# Patient Record
Sex: Male | Born: 1955 | Race: Black or African American | Hispanic: No | Marital: Single | Smoking: Current every day smoker
Health system: Southern US, Community
[De-identification: ages and names within clinical notes are randomized; demographics above are authoritative.]

## PROBLEM LIST (undated history)

## (undated) DIAGNOSIS — M1990 Unspecified osteoarthritis, unspecified site: Secondary | ICD-10-CM

## (undated) DIAGNOSIS — I639 Cerebral infarction, unspecified: Secondary | ICD-10-CM

## (undated) DIAGNOSIS — G8929 Other chronic pain: Secondary | ICD-10-CM

## (undated) DIAGNOSIS — I10 Essential (primary) hypertension: Secondary | ICD-10-CM

## (undated) DIAGNOSIS — R413 Other amnesia: Secondary | ICD-10-CM

## (undated) HISTORY — DX: Other chronic pain: G89.29

## (undated) HISTORY — DX: Essential (primary) hypertension: I10

## (undated) HISTORY — DX: Unspecified osteoarthritis, unspecified site: M19.90

## (undated) HISTORY — DX: Cerebral infarction, unspecified: I63.9

---
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
## 2017-09-02 ENCOUNTER — Other Ambulatory Visit: Payer: Self-pay

## 2017-09-02 ENCOUNTER — Encounter (HOSPITAL_COMMUNITY): Payer: Self-pay | Admitting: Emergency Medicine

## 2017-09-02 ENCOUNTER — Emergency Department (HOSPITAL_COMMUNITY): Payer: Medicaid Other

## 2017-09-02 ENCOUNTER — Inpatient Hospital Stay (HOSPITAL_COMMUNITY)
Admission: EM | Admit: 2017-09-02 | Discharge: 2017-09-05 | DRG: 065 | Disposition: A | Discharge status: Rehabilitation Facility | Payer: Medicaid Other | Attending: Neurology | Admitting: Neurology

## 2017-09-02 DIAGNOSIS — I129 Hypertensive chronic kidney disease with stage 1 through stage 4 chronic kidney disease, or unspecified chronic kidney disease: Secondary | ICD-10-CM | POA: Diagnosis present

## 2017-09-02 DIAGNOSIS — I610 Nontraumatic intracerebral hemorrhage in hemisphere, subcortical: Secondary | ICD-10-CM

## 2017-09-02 DIAGNOSIS — R4182 Altered mental status, unspecified: Secondary | ICD-10-CM | POA: Diagnosis present

## 2017-09-02 DIAGNOSIS — F17200 Nicotine dependence, unspecified, uncomplicated: Secondary | ICD-10-CM | POA: Diagnosis not present

## 2017-09-02 DIAGNOSIS — Z8249 Family history of ischemic heart disease and other diseases of the circulatory system: Secondary | ICD-10-CM | POA: Diagnosis not present

## 2017-09-02 DIAGNOSIS — R269 Unspecified abnormalities of gait and mobility: Secondary | ICD-10-CM | POA: Diagnosis not present

## 2017-09-02 DIAGNOSIS — I10 Essential (primary) hypertension: Secondary | ICD-10-CM | POA: Diagnosis not present

## 2017-09-02 DIAGNOSIS — R4701 Aphasia: Secondary | ICD-10-CM | POA: Diagnosis present

## 2017-09-02 DIAGNOSIS — R001 Bradycardia, unspecified: Secondary | ICD-10-CM | POA: Diagnosis present

## 2017-09-02 DIAGNOSIS — G8194 Hemiplegia, unspecified affecting left nondominant side: Secondary | ICD-10-CM | POA: Diagnosis present

## 2017-09-02 DIAGNOSIS — I615 Nontraumatic intracerebral hemorrhage, intraventricular: Secondary | ICD-10-CM | POA: Diagnosis present

## 2017-09-02 DIAGNOSIS — N182 Chronic kidney disease, stage 2 (mild): Secondary | ICD-10-CM | POA: Diagnosis present

## 2017-09-02 DIAGNOSIS — F17210 Nicotine dependence, cigarettes, uncomplicated: Secondary | ICD-10-CM | POA: Diagnosis present

## 2017-09-02 DIAGNOSIS — D37030 Neoplasm of uncertain behavior of the parotid salivary glands: Secondary | ICD-10-CM | POA: Diagnosis present

## 2017-09-02 DIAGNOSIS — R7989 Other specified abnormal findings of blood chemistry: Secondary | ICD-10-CM | POA: Diagnosis not present

## 2017-09-02 DIAGNOSIS — I161 Hypertensive emergency: Secondary | ICD-10-CM | POA: Diagnosis present

## 2017-09-02 DIAGNOSIS — R29703 NIHSS score 3: Secondary | ICD-10-CM | POA: Diagnosis present

## 2017-09-02 DIAGNOSIS — E785 Hyperlipidemia, unspecified: Secondary | ICD-10-CM | POA: Diagnosis present

## 2017-09-02 DIAGNOSIS — I619 Nontraumatic intracerebral hemorrhage, unspecified: Secondary | ICD-10-CM | POA: Diagnosis not present

## 2017-09-02 DIAGNOSIS — Z7982 Long term (current) use of aspirin: Secondary | ICD-10-CM

## 2017-09-02 DIAGNOSIS — I69398 Other sequelae of cerebral infarction: Secondary | ICD-10-CM | POA: Diagnosis not present

## 2017-09-02 DIAGNOSIS — N183 Chronic kidney disease, stage 3 (moderate): Secondary | ICD-10-CM | POA: Diagnosis not present

## 2017-09-02 DIAGNOSIS — I69319 Unspecified symptoms and signs involving cognitive functions following cerebral infarction: Secondary | ICD-10-CM | POA: Diagnosis not present

## 2017-09-02 DIAGNOSIS — R29810 Facial weakness: Secondary | ICD-10-CM | POA: Diagnosis present

## 2017-09-02 DIAGNOSIS — D649 Anemia, unspecified: Secondary | ICD-10-CM | POA: Diagnosis not present

## 2017-09-02 LAB — COMPREHENSIVE METABOLIC PANEL
ALBUMIN: 3.9 g/dL (ref 3.5–5.0)
ALT: 22 U/L (ref 0–44)
AST: 24 U/L (ref 15–41)
Alkaline Phosphatase: 104 U/L (ref 38–126)
Anion gap: 9 (ref 5–15)
BUN: 12 mg/dL (ref 8–23)
CHLORIDE: 106 mmol/L (ref 98–111)
CO2: 23 mmol/L (ref 22–32)
Calcium: 9.3 mg/dL (ref 8.9–10.3)
Creatinine, Ser: 1.5 mg/dL — ABNORMAL HIGH (ref 0.61–1.24)
GFR calc Af Amer: 56 mL/min — ABNORMAL LOW (ref >60)
GFR calc non Af Amer: 48 mL/min — ABNORMAL LOW (ref >60)
GLUCOSE: 100 mg/dL — AB (ref 70–99)
Potassium: 3.9 mmol/L (ref 3.5–5.1)
SODIUM: 138 mmol/L (ref 135–145)
Total Bilirubin: 0.5 mg/dL (ref 0.3–1.2)
Total Protein: 7.8 g/dL (ref 6.5–8.1)

## 2017-09-02 LAB — DIFFERENTIAL
Basophils Absolute: 0.1 10*3/uL (ref 0.0–0.1)
Basophils Relative: 1 %
EOS ABS: 0.1 10*3/uL (ref 0.0–0.7)
Eosinophils Relative: 1 %
Lymphocytes Relative: 26 %
Lymphs Abs: 1.7 10*3/uL (ref 0.7–4.0)
MONOS PCT: 7 %
Monocytes Absolute: 0.5 10*3/uL (ref 0.1–1.0)
NEUTROS PCT: 65 %
Neutro Abs: 4.2 10*3/uL (ref 1.7–7.7)

## 2017-09-02 LAB — I-STAT CHEM 8, ED
BUN: 16 mg/dL (ref 8–23)
CALCIUM ION: 1.17 mmol/L (ref 1.15–1.40)
Chloride: 104 mmol/L (ref 98–111)
Creatinine, Ser: 1.5 mg/dL — ABNORMAL HIGH (ref 0.61–1.24)
Glucose, Bld: 99 mg/dL (ref 70–99)
HCT: 34 % — ABNORMAL LOW (ref 39.0–52.0)
Hemoglobin: 11.6 g/dL — ABNORMAL LOW (ref 13.0–17.0)
Potassium: 4 mmol/L (ref 3.5–5.1)
SODIUM: 140 mmol/L (ref 135–145)
TCO2: 26 mmol/L (ref 22–32)

## 2017-09-02 LAB — CBC
HCT: 34.1 % — ABNORMAL LOW (ref 39.0–52.0)
Hemoglobin: 9.4 g/dL — ABNORMAL LOW (ref 13.0–17.0)
MCH: 19.1 pg — AB (ref 26.0–34.0)
MCHC: 27.6 g/dL — AB (ref 30.0–36.0)
MCV: 69.3 fL — AB (ref 78.0–100.0)
PLATELETS: 372 10*3/uL (ref 150–400)
RBC: 4.92 MIL/uL (ref 4.22–5.81)
RDW: 18.8 % — AB (ref 11.5–15.5)
WBC: 6.6 10*3/uL (ref 4.0–10.5)

## 2017-09-02 LAB — PROTIME-INR
INR: 1.02
PROTHROMBIN TIME: 13.3 s (ref 11.4–15.2)

## 2017-09-02 LAB — APTT: aPTT: 28 seconds (ref 24–36)

## 2017-09-02 LAB — I-STAT TROPONIN, ED: Troponin i, poc: 0.01 ng/mL (ref 0.00–0.08)

## 2017-09-02 LAB — ETHANOL

## 2017-09-02 MED ORDER — LABETALOL HCL 5 MG/ML IV SOLN
20.0000 mg | Freq: Once | INTRAVENOUS | Status: AC
  Administered 2017-09-02: 20 mg via INTRAVENOUS

## 2017-09-02 MED ORDER — LABETALOL HCL 5 MG/ML IV SOLN
10.0000 mg | Freq: Once | INTRAVENOUS | Status: AC
  Administered 2017-09-02: 10 mg via INTRAVENOUS

## 2017-09-02 MED ORDER — FAMOTIDINE IN NACL 20-0.9 MG/50ML-% IV SOLN
20.0000 mg | Freq: Two times a day (BID) | INTRAVENOUS | Status: DC
  Administered 2017-09-03: 20 mg via INTRAVENOUS
  Filled 2017-09-02: qty 50

## 2017-09-02 MED ORDER — ACETAMINOPHEN 650 MG RE SUPP: 650.0000 mg | RECTAL | Status: DC | PRN

## 2017-09-02 MED ORDER — CLEVIDIPINE BUTYRATE 0.5 MG/ML IV EMUL: 0.0000 mg/h | INTRAVENOUS | Status: DC

## 2017-09-02 MED ORDER — STROKE: EARLY STAGES OF RECOVERY BOOK
Freq: Once | Status: AC
  Administered 2017-09-03: 04:00:00
  Filled 2017-09-02 (×2): qty 1

## 2017-09-02 MED ORDER — ACETAMINOPHEN 160 MG/5ML PO SOLN: 650.0000 mg | ORAL | Status: DC | PRN

## 2017-09-02 MED ORDER — NICARDIPINE HCL IN NACL 20-0.86 MG/200ML-% IV SOLN
0.0000 mg/h | INTRAVENOUS | Status: DC
  Administered 2017-09-02: 7.5 mg/h via INTRAVENOUS
  Administered 2017-09-02: 2 mg/h via INTRAVENOUS
  Administered 2017-09-03: 3 mg/h via INTRAVENOUS
  Filled 2017-09-02 (×2): qty 200

## 2017-09-02 MED ORDER — SENNOSIDES-DOCUSATE SODIUM 8.6-50 MG PO TABS
1.0000 | ORAL_TABLET | Freq: Two times a day (BID) | ORAL | Status: DC
  Administered 2017-09-03 – 2017-09-05 (×3): 1 via ORAL
  Filled 2017-09-02 (×4): qty 1

## 2017-09-02 MED ORDER — NICARDIPINE HCL IN NACL 20-0.86 MG/200ML-% IV SOLN: 0.0000 mg/h | INTRAVENOUS | Status: DC

## 2017-09-02 MED ORDER — ACETAMINOPHEN 325 MG PO TABS
650.0000 mg | ORAL_TABLET | ORAL | Status: DC | PRN
  Administered 2017-09-03 – 2017-09-04 (×2): 650 mg via ORAL
  Filled 2017-09-02 (×2): qty 2

## 2017-09-02 MED ORDER — NICARDIPINE HCL IN NACL 20-0.86 MG/200ML-% IV SOLN
INTRAVENOUS | Status: AC
  Filled 2017-09-02: qty 200

## 2017-09-02 MED ORDER — LABETALOL HCL 5 MG/ML IV SOLN
INTRAVENOUS | Status: AC
  Filled 2017-09-02: qty 4

## 2017-09-02 NOTE — ED Provider Notes (Signed)
[facility] [place] EMERGENCY DEPARTMENT Provider Note   CSN: [id number] Arrival date & time: 09/02/17  2135   LEVEL 5 CAVEAT - ALTERED MENTAL STATUS   History   Chief Complaint No chief complaint on file.   HPI Luis Wiley is a 62 y.o. male.  HPI  62 year old man brought in as a code stroke.  Patient all of a sudden started acting differently.  Speaking was less clear than normal.  Found to have left grip strength weaker than right.  History is limited as the patient appears confused.  No past medical history on file.  Patient Active Problem List   Diagnosis Date Noted  . ICH (intracerebral hemorrhage) ([place]) 09/02/2017          Home Medications    Prior to Admission medications   Not on File    Family History No family history on file.  Social History Social History   Tobacco Use  . Smoking status: Not on file  Substance Use Topics  . Alcohol use: Not on file  . Drug use: Not on file     Allergies   Patient has no allergy information on record.   Review of Systems Review of Systems  Unable to perform ROS: Mental status change     Physical Exam Updated Vital Signs BP 137/89 (BP Location: Right Arm)   Pulse 85   Temp 98.4 F (36.9 C) (Axillary)   Resp 20   Ht 6' (1.829 m)   Wt 81.6 kg (180 lb)   SpO2 100%   BMI 24.41 kg/m   Physical Exam  Constitutional: He appears well-developed and well-nourished. No distress.  HENT:  Head: Normocephalic and atraumatic.  Right Ear: External ear normal.  Left Ear: External ear normal.  Nose: Nose normal.  Eyes: Right eye exhibits no discharge. Left eye exhibits no discharge.  Neck: Neck supple.  Cardiovascular: Normal rate, regular rhythm and normal heart sounds.  Pulmonary/Chest: Effort normal and breath sounds normal.  Abdominal: Soft. There is no tenderness.  Musculoskeletal: He exhibits no edema.  Neurological: He is alert. He is disoriented.  Awake alert but confused.  He is  oriented to the year and president but this oriented to the month and day of the week.  He knows where he lives.  5/5 strength in right upper extremity, right lower extremity, and left lower extremity.  Weakness to the left hand grip but otherwise left upper show any normal.  Speaks slowly.  Skin: Skin is warm and dry. He is not diaphoretic.  Nursing note and vitals reviewed.    ED Treatments / Results  Labs (all labs ordered are listed, but only abnormal results are displayed) Labs Reviewed  I-STAT CHEM 8, ED - Abnormal; Notable for the following components:      Result Value   Creatinine, Ser 1.50 (*)    Hemoglobin 11.6 (*)    HCT 34.0 (*)    All other components within normal limits  ETHANOL  PROTIME-INR  APTT  CBC  DIFFERENTIAL  COMPREHENSIVE METABOLIC PANEL  RAPID URINE DRUG SCREEN, HOSP PERFORMED  URINALYSIS, ROUTINE W REFLEX MICROSCOPIC  I-STAT TROPONIN, ED    EKG None  ED ECG REPORT   Date: 09/02/2017  Rate: 82  Rhythm: normal sinus rhythm  QRS Axis: normal  Intervals: normal  ST/T Wave abnormalities: nonspecific T wave changes  Conduction Disutrbances:none  Narrative Interpretation:   Old EKG Reviewed: none available  I have personally reviewed the EKG tracing and agree  with the computerized printout as noted.   Radiology Ct Head Code Stroke Wo Contrast  Result Date: 09/02/2017 CLINICAL DATA:  Code stroke.  LEFT-sided weakness, aphasia. EXAM: CT HEAD WITHOUT CONTRAST TECHNIQUE: Contiguous axial images were obtained from the base of the skull through the vertex without intravenous contrast. COMPARISON:  None. FINDINGS: BRAIN: 1.4 x 1.6 x 2.5 cm (volume = 2.9 cc) RIGHT thalamic intraparenchymal hematoma with minimal surrounding low-density vasogenic edema. Intraventricular blood products in the third, fourth and lateral ventricles. Regional mass effect without midline shift. No hydrocephalus. Confluent supratentorial white matter hypodensities. Age indeterminate  LEFT thalamus lacunar infarct. More chronic appearing bilateral basal ganglia lacunar infarcts. No acute large vascular territory infarct. No abnormal extra-axial fluid collections. VASCULAR: Mild calcific atherosclerosis of the carotid siphons. Dolichoectatic intracranial vessels seen with chronic hypertension. SKULL: No skull fracture. No significant scalp soft tissue swelling. SINUSES/ORBITS: Severe chronic LEFT maxillary sinusitis with mucoperiosteal reaction, mild antral expansion associated with mucoceles. Mild remaining paranasal sinus mucosal thickening. RIGHT mastoid effusion with coalescence. Included ocular globes and orbital contents are non-suspicious. OTHER: None. IMPRESSION: 1. Acute RIGHT thalamus hematoma with intraventricular extension. No midline shift or hydrocephalus. 2. Age indeterminate LEFT thalamus lacunar infarct. Chronic appearing bilateral basal ganglia lacunar infarcts. 3. Critical Value/emergent results were called by telephone at the time of interpretation on 09/02/2017 at 9:48 pm to Dr. Theotis Burrow , who verbally acknowledged these results. Critical Value/emergent results text paged to [place], Neurology via [employer] secure system on 09/02/2017 at 9:45 pm, including interpreting physician's phone number. Electronically Signed   By: Elon Alas M.D.   On: 09/02/2017 21:51    Procedures .Critical Care Performed by: Sherwood Gambler, MD Authorized by: Sherwood Gambler, MD   Critical care provider statement:    Critical care time (minutes):  30   Critical care was necessary to treat or prevent imminent or life-threatening deterioration of the following conditions:  CNS failure or compromise   Critical care was time spent personally by me on the following activities:  Development of treatment plan with patient or surrogate, discussions with consultants, evaluation of patient's response to treatment, examination of patient, obtaining history from patient or surrogate, ordering  and performing treatments and interventions, ordering and review of laboratory studies, ordering and review of radiographic studies, pulse oximetry, re-evaluation of patient's condition and review of old charts   (including critical care time)  Medications Ordered in ED Medications  niCARdipine in saline (CARDENE-IV) 20-0.86 MG/200ML-% infusion SOLN (has no administration in time range)   stroke: mapping our early stages of recovery book (has no administration in time range)  acetaminophen (TYLENOL) tablet 650 mg (has no administration in time range)    Or  acetaminophen (TYLENOL) solution 650 mg (has no administration in time range)    Or  acetaminophen (TYLENOL) suppository 650 mg (has no administration in time range)  senna-docusate (Senokot-S) tablet 1 tablet (has no administration in time range)  famotidine (PEPCID) IVPB 20 mg premix (has no administration in time range)  labetalol (NORMODYNE,TRANDATE) 5 MG/ML injection (has no administration in time range)  nicardipine (CARDENE) 20mg  in 0.86% saline 278ml IV infusion (0.1 mg/ml) (7.5 mg/hr Intravenous Rate/Dose Change 09/02/17 2158)  labetalol (NORMODYNE,TRANDATE) injection 10 mg (has no administration in time range)  labetalol (NORMODYNE,TRANDATE) injection 10 mg (10 mg Intravenous Given 09/02/17 2157)     Initial Impression / Assessment and Plan / ED Course  I have reviewed the triage vital signs and the nursing notes.  Pertinent labs & imaging  results that were available during my care of the patient were reviewed by me and considered in my medical decision making (see chart for details).     Patient presents with altered mental status and code stroke and found to have intracerebral hemorrhage.  He is hypertensive on arrival and was started on nicardipine.  He was given labetalol by neurology and after these treatments he finally does have good blood pressure control.  He is on aspirin but no other blood thinners.  Admit to the  neuro ICU.  Final Clinical Impressions(s) / ED Diagnoses   Final diagnoses:  Nontraumatic intracerebral hemorrhage, unspecified cerebral location, unspecified laterality [facility])    ED Discharge Orders    None       Sherwood Gambler, MD 09/03/17 0009

## 2017-09-02 NOTE — ED Notes (Signed)
ED Provider at bedside. 

## 2017-09-02 NOTE — ED Triage Notes (Signed)
Per EMS, pt coming from home with complaints of sudden left sided weakness and aphasia starting at 1800. Pt stated he got a sudden headache and started vomiting as well. Pt family members stated that he went unresponsive shortly after and was confused.

## 2017-09-02 NOTE — ED Notes (Signed)
Pt in room

## 2017-09-02 NOTE — ED Notes (Signed)
Dr. Aroor at bedside 

## 2017-09-02 NOTE — H&P (Signed)
Chief Complaint: Left-sided weakness  History obtained from: Patient and Chart    HPI:                                                                                                                                       Luis Wiley is an 62 y.o. male with no documented medical history as he does not seek medical attention presents to [name], ER as a stroke alert for sudden onset left-sided weakness and confusion that began around 6 PM.  Patient was in his usual state of health and around 6 PM he was noted to be confused, slurring his words.  EMS was called and patient complained of the headache and noted to have left-sided weakness.  His speech was slurred and had trouble answering questions and therefore stroke alerted.  Patient vomited twice en route to the hospital.  Blood pressure was 606 systolic per EMS.  Family states that last year he had episode where he had gait imbalance and left-sided weakness.  Since then he would have intermittent episodes of worsening left-sided weakness.  Date last known well:  Time last known well:  tPA Given:  NIHSS:  Baseline MRS  Intracerebral Hemorrhage (ICH) Score  Glascow Coma Score  13-15 0  Age >/= 80 no 0  ICH volume >/= 19ml  no 0  IVH yes +1  Infratentorial origin no 0 Total:  1   History reviewed. No pertinent past medical history.  History reviewed. No pertinent surgical history.  History reviewed. No pertinent family history. Social History:  has no tobacco, alcohol, and drug history on file.  Allergies: Not on File  Medications:                                                                                                                        I reviewed home medications   ROS:  14 systems reviewed and negative except above    Examination:                                                                                                       General: Appears well-developed and well-nourished.  Psych: Affect appropriate to situation Eyes: No scleral injection HENT: No OP obstrucion Head: Normocephalic.  Cardiovascular: Normal rate and regular rhythm.  Respiratory: Effort normal and breath sounds normal to anterior ascultation GI: Soft.  No distension. There is no tenderness.  Skin: WDI    Neurological Examination Mental Status: Awake but drowsy, oriented, thought content mostly appropriate.  Stated month incorrectly.  Speech fluent. Able to follow 3 step commands without difficulty. Cranial Nerves: II: Visual fields grossly normal,  III,IV, VI: ptosis not present, extra-ocular motions intact bilaterally, pupils equal, round, reactive to light and accommodation V,VII: smile symmetric, facial light touch sensation normal bilaterally VIII: hearing normal bilaterally IX,X: uvula rises symmetrically XI: bilateral shoulder shrug XII: midline tongue extension Motor: Right : Upper extremity   5/5    Left:     Upper extremity   4/5  Lower extremity   5/5     Lower extremity   5/5 Tone and bulk:normal tone throughout; no atrophy noted Sensory: Pinprick and light touch intact throughout, bilaterally Deep Tendon Reflexes: 2+ and symmetric throughout Plantars: Right: downgoing   Left: downgoing Cerebellar: normal finger-to-nose, normal rapid alternating movements  Gait: Did not assess   Lab Results: Basic Metabolic Panel: Recent Labs  Lab 09/02/17 05/12/32 09/02/17 2146  NA 138 140  K 3.9 4.0  CL 106 104  CO2 23  --   GLUCOSE 100* 99  BUN 12 16  CREATININE 1.50* 1.50*  CALCIUM 9.3  --     CBC: Recent Labs  Lab 09/02/17 05-12-2132 09/02/17 2146  WBC 6.6  --   NEUTROABS 4.2  --   HGB 9.4* 11.6*  HCT 34.1* 34.0*  MCV 69.3*  --   PLT 372  --     Coagulation Studies: Recent Labs    09/02/17 05-12-2132  LABPROT 13.3  INR 1.02    Imaging: Ct Head Code Stroke Wo  Contrast  Result Date: 09/02/2017 CLINICAL DATA:  Code stroke.  LEFT-sided weakness, aphasia. EXAM: CT HEAD WITHOUT CONTRAST TECHNIQUE: Contiguous axial images were obtained from the base of the skull through the vertex without intravenous contrast. COMPARISON:  None. FINDINGS: BRAIN: 1.4 x 1.6 x 2.5 cm (volume = 2.9 cc) RIGHT thalamic intraparenchymal hematoma with minimal surrounding low-density vasogenic edema. Intraventricular blood products in the third, fourth and lateral ventricles. Regional mass effect without midline shift. No hydrocephalus. Confluent supratentorial white matter hypodensities. Age indeterminate LEFT thalamus lacunar infarct. More chronic appearing bilateral basal ganglia lacunar infarcts. No acute large vascular territory infarct. No abnormal extra-axial fluid collections. VASCULAR: Mild calcific atherosclerosis of the carotid siphons. Dolichoectatic intracranial vessels seen with chronic hypertension. SKULL: No skull fracture. No significant scalp soft tissue swelling. SINUSES/ORBITS: Severe chronic LEFT maxillary sinusitis with mucoperiosteal reaction, mild antral expansion associated with mucoceles. Mild remaining paranasal sinus mucosal  thickening. RIGHT mastoid effusion with coalescence. Included ocular globes and orbital contents are non-suspicious. OTHER: None. IMPRESSION: 1. Acute RIGHT thalamus hematoma with intraventricular extension. No midline shift or hydrocephalus. 2. Age indeterminate LEFT thalamus lacunar infarct. Chronic appearing bilateral basal ganglia lacunar infarcts. 3. Critical Value/emergent results were called by telephone at the time of interpretation on 09/02/2017 at 9:48 pm to Dr. Theotis Burrow , who verbally acknowledged these results. Critical Value/emergent results text paged to [place], Neurology via [employer] secure system on 09/02/2017 at 9:45 pm, including interpreting physician's phone number. Electronically Signed   By: Elon Alas M.D.   On: 09/02/2017  21:51     ASSESSMENT AND PLAN  62 year old male with likely history of hypertension, possible old stroke presents with sudden onset left-sided weakness and confusion.  CT head shows right thalamic hemorrhage with intraventricular extension but no hydrocephalus. ICH score is 1.  Patient treated immediately with IV labetalol and nicardipine to control blood pressure.  Difficult to control pressure and took approximately 30 minutes to achieve target levels of less than 500 systolic.  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 NIHSS and neurochecks Aggressive BP management No AP/AC SCD for DVT PPX  Hypertensive emergency BP goal <370  Systolic, started on Cardene gtt  AKI vs possible CKD Creatinine 1.5 IV fluids, avoid nephrotoxic medications  DVT PPX; SCD Diet: Swallow eval pending    This patient is neurologically critically ill due to [place] with IVH.  He is at risk for significant risk of neurological worsening from recent signs of hemorrhage ,cerebral edema, brain herniation, heart failure,  infection, respiratory failure and seizure. This patient's care requires constant monitoring of vital signs, hemodynamics, respiratory and cardiac monitoring, review of multiple databases, neurological assessment, discussion with family, other specialists and medical decision making of high complexity.  I spent 60  minutes of neurocritical time in the care of this patient.  ###  I reviewed his repeat CT head performed 1 AM-stable signs of hemorrhage and no hydrocephalus.    Kendrea Cerritos [facility] Pager Number [phone]

## 2017-09-03 ENCOUNTER — Encounter (HOSPITAL_COMMUNITY): Payer: Self-pay | Admitting: *Deleted

## 2017-09-03 ENCOUNTER — Other Ambulatory Visit (HOSPITAL_COMMUNITY): Payer: Self-pay

## 2017-09-03 ENCOUNTER — Inpatient Hospital Stay (HOSPITAL_COMMUNITY): Payer: Medicaid Other

## 2017-09-03 DIAGNOSIS — R7989 Other specified abnormal findings of blood chemistry: Secondary | ICD-10-CM

## 2017-09-03 DIAGNOSIS — F17200 Nicotine dependence, unspecified, uncomplicated: Secondary | ICD-10-CM

## 2017-09-03 DIAGNOSIS — I615 Nontraumatic intracerebral hemorrhage, intraventricular: Principal | ICD-10-CM

## 2017-09-03 LAB — RAPID URINE DRUG SCREEN, HOSP PERFORMED
AMPHETAMINES: NOT DETECTED
BARBITURATES: NOT DETECTED
BENZODIAZEPINES: NOT DETECTED
Cocaine: NOT DETECTED
Opiates: NOT DETECTED
TETRAHYDROCANNABINOL: NOT DETECTED

## 2017-09-03 LAB — URINALYSIS, ROUTINE W REFLEX MICROSCOPIC
BILIRUBIN URINE: NEGATIVE
Bacteria, UA: NONE SEEN
GLUCOSE, UA: NEGATIVE mg/dL
Ketones, ur: NEGATIVE mg/dL
Leukocytes, UA: NEGATIVE
Nitrite: NEGATIVE
PH: 6 (ref 5.0–8.0)
Protein, ur: NEGATIVE mg/dL
SPECIFIC GRAVITY, URINE: 1.005 (ref 1.005–1.030)

## 2017-09-03 LAB — MRSA PCR SCREENING: MRSA by PCR: NEGATIVE

## 2017-09-03 MED ORDER — HYDRALAZINE HCL 20 MG/ML IJ SOLN
5.0000 mg | INTRAMUSCULAR | Status: DC | PRN
  Administered 2017-09-03: 10 mg via INTRAVENOUS
  Filled 2017-09-03: qty 1

## 2017-09-03 MED ORDER — IOPAMIDOL (ISOVUE-370) INJECTION 76%
INTRAVENOUS | Status: AC
  Filled 2017-09-03: qty 50

## 2017-09-03 MED ORDER — HYDRALAZINE HCL 20 MG/ML IJ SOLN: 5.0000 mg | INTRAMUSCULAR | Status: DC

## 2017-09-03 MED ORDER — IOPAMIDOL (ISOVUE-370) INJECTION 76%
50.0000 mL | Freq: Once | INTRAVENOUS | Status: AC | PRN
  Administered 2017-09-03: 50 mL via INTRAVENOUS

## 2017-09-03 MED ORDER — SODIUM CHLORIDE 0.9 % IV SOLN
INTRAVENOUS | Status: DC
  Administered 2017-09-03 – 2017-09-04 (×3): via INTRAVENOUS

## 2017-09-03 MED ORDER — SODIUM CHLORIDE 0.9 % IV SOLN
INTRAVENOUS | Status: DC | PRN
  Administered 2017-09-03: 250 mL via INTRAVENOUS

## 2017-09-03 MED ORDER — PANTOPRAZOLE SODIUM 40 MG PO TBEC
40.0000 mg | DELAYED_RELEASE_TABLET | Freq: Every day | ORAL | Status: DC
  Administered 2017-09-03 – 2017-09-05 (×3): 40 mg via ORAL
  Filled 2017-09-03 (×3): qty 1

## 2017-09-03 NOTE — Progress Notes (Signed)
Inpatient Rehabilitation  Per PT and OT request, patient was screened by Gunnar Fusi for appropriateness for an Inpatient Acute Rehab consult.  At this time we are planning on following along prior to requesting an Inpatient Rehab consult given that patient is Min A on day of evaluation.  Call if questions.   Carmelia Roller., CCC/SLP Admission Coordinator  [place]  Cell [phone]

## 2017-09-03 NOTE — Evaluation (Signed)
Physical Therapy Evaluation Patient Details Name: Luis Wiley MRN: [id number] DOB: 12-16-1955 Today's Date: 09/03/2017   History of Present Illness  62 yo male smoker admitted with L sided weakness and aphasia. MRI (+) R thalamas ICH with extension into IVH  pt with bradycardia PMH: L CVA Basal ganglia   Clinical Impression  Orders received for PT evaluation. Patient demonstrates deficits in functional mobility as indicated below. Will benefit from continued skilled PT to address deficits and maximize function. Will see as indicated and progress as tolerated.  At this time, patient presenting with cognitive and functional mobility deficits. Feel patient would benefit from comprehensive therapies to address deficits and maximize recovery. Prior to admission, patient was completely independent per sister who is at bedside. Recommend CIR consult at this time.    Follow Up Recommendations CIR;Supervision/Assistance - 24 hour    Equipment Recommendations  None recommended by PT    Recommendations for Other Services       Precautions / Restrictions Precautions Precautions: Fall Restrictions Weight Bearing Restrictions: No      Mobility  Bed Mobility Overal bed mobility: Needs Assistance Bed Mobility: Supine to Sit     Supine to sit: Min assist     General bed mobility comments: min asssit for stability and line management upon coming to EOB  Transfers Overall transfer level: Needs assistance Equipment used: 1 person hand held assist Transfers: Sit to/from Stand Sit to Stand: Min assist         General transfer comment: min assist for stability upon coming to upright, posterior list noted initially  Ambulation/Gait Ambulation/Gait assistance: Min assist Gait Distance (Feet): 100 Feet Assistive device: 1 person hand held assist Gait Pattern/deviations: Step-through pattern;Decreased stride length;Decreased step length - left;Drifts right/left Gait velocity: decreased    General Gait Details: patient with noted instability, and poor awareness of environment. Patient with increased left LE lag at times causing patient to lose balance requiring increased physical assist. Hands on min assist at all times for basic level ambulation  Stairs            Wheelchair Mobility    Modified Rankin (Stroke Patients Only) Modified Rankin (Stroke Patients Only) Pre-Morbid Rankin Score: No symptoms Modified Rankin: Moderately severe disability     Balance Overall balance assessment: Needs assistance   Sitting balance-Leahy Scale: Good       Standing balance-Leahy Scale: Poor Standing balance comment: increased sway noted, instability present during static tasks             High level balance activites: Direction changes;Turns;Head turns High Level Balance Comments: patient with poor awareness of environment during attempts at higher level tasks. Continuously running in to objects on left with left drift noted             Pertinent Vitals/Pain Pain Assessment: Faces Faces Pain Scale: Hurts little more Pain Location: headache Pain Descriptors / Indicators: Headache Pain Intervention(s): Monitored during session;Premedicated before session;Repositioned    Home Living Family/patient expects to be discharged to:: Private residence Living Arrangements: Other relatives(with sister) Available Help at Discharge: Family;Available PRN/intermittently Type of Home: House Home Access: Stairs to enter Entrance Stairs-Rails: Left Entrance Stairs-Number of Steps: 3 Home Layout: One level Home Equipment: None Additional Comments: questionable historian at this time    Prior Function Level of Independence: Independent               Hand Dominance   Dominant Hand: Right    Extremity/Trunk Assessment   Upper Extremity  Assessment Upper Extremity Assessment: LUE deficits/detail LUE Deficits / Details: AROM FF 100 degrees, able to open soap  bottle with incr time and using L UE as a stabilizer only LUE Coordination: decreased fine motor    Lower Extremity Assessment Lower Extremity Assessment: Defer to PT evaluation LLE Deficits / Details: noted LLE modest weakness and coordination difficulty ? related to poor awareness LLE Coordination: decreased gross motor    Cervical / Trunk Assessment Cervical / Trunk Assessment: Kyphotic  Communication   Communication: Expressive difficulties  Cognition Arousal/Alertness: Awake/alert Behavior During Therapy: Flat affect Overall Cognitive Status: Impaired/Different from baseline Area of Impairment: Orientation;Attention;Memory;Following commands;Safety/judgement;Awareness;Problem solving                 Orientation Level: Disoriented to;Time;Situation Current Attention Level: Sustained Memory: Decreased short-term memory Following Commands: Follows one step commands with increased time Safety/Judgement: Decreased awareness of safety;Decreased awareness of deficits Awareness: Intellectual Problem Solving: Slow processing;Decreased initiation;Requires verbal cues;Requires tactile cues General Comments: pt attempting to put sock on R foot x2 and pt attempting to leave bathroom with water running. pt needed x2 questioning cues to problem solve turning off the water. pt getting up without help to attempt bathroom transfer. pt walking to the bathroom to void bladder with condom cath in place and lackes awareness      General Comments      Exercises     Assessment/Plan    PT Assessment Patient needs continued PT services  PT Problem List Decreased strength;Decreased activity tolerance;Decreased balance;Decreased mobility;Decreased coordination;Decreased cognition;Decreased safety awareness       PT Treatment Interventions DME instruction;Gait training;Stair training;Functional mobility training;Therapeutic activities;Therapeutic exercise;Balance training;Neuromuscular  re-education;Cognitive remediation;Patient/family education    PT Goals (Current goals can be found in the Care Plan section)  Acute Rehab PT Goals Patient Stated Goal: to go home PT Goal Formulation: With patient/family Time For Goal Achievement: 09/17/17 Potential to Achieve Goals: Good    Frequency Min 3X/week   Barriers to discharge        Co-evaluation               AM-PAC PT "6 Clicks" Daily Activity  Outcome Measure Difficulty turning over in bed (including adjusting bedclothes, sheets and blankets)?: A Little Difficulty moving from lying on back to sitting on the side of the bed? : Unable Difficulty sitting down on and standing up from a chair with arms (e.g., wheelchair, bedside commode, etc,.)?: Unable Help needed moving to and from a bed to chair (including a wheelchair)?: A Little Help needed walking in hospital room?: A Little Help needed climbing 3-5 steps with a railing? : A Lot 6 Click Score: 13    End of Session Equipment Utilized During Treatment: Gait belt Activity Tolerance: Patient tolerated treatment well Patient left: in chair;with call bell/phone within reach;with chair alarm set;with family/visitor present Nurse Communication: Mobility status PT Visit Diagnosis: Unsteadiness on feet (R26.81);Other symptoms and signs involving the nervous system (R29.898)    Time: 6384-6659 PT Time Calculation (min) (ACUTE ONLY): 17 min   Charges:   PT Evaluation $PT Eval Moderate Complexity: 1 Mod          Alben Deeds, PT DPT  Board Certified Neurologic Specialist [place] 09/03/2017, 10:12 AM

## 2017-09-03 NOTE — Progress Notes (Signed)
OT NOTE EVALUATION  PT admitted with R Thalamus ICH with extension IVH. Pt currently with functional limitiations due to the deficits listed below (see OT problem list). PTA was independent for all adls per patient and sister. Pt currently with some L inattention and visual changes noted during evaluation. OT to further assess visual changes.  Pt will benefit from skilled OT to increase their independence and safety with adls and balance to allow discharge CIR.    09/03/17 1000  OT Visit Information  Last OT Received On 09/03/17  Assistance Needed +1  History of Present Illness 62 yo male smoker admitted with L sided weakness and aphasia. MRI (+) R thalamas ICH with extension into IVH  pt with bradycardia PMH: L CVA Basal ganglia   Precautions  Precautions Fall  Restrictions  Weight Bearing Restrictions No  Home Living  Family/patient expects to be discharged to: Private residence  Living Arrangements Other relatives (with sister)  Available Help at Discharge Family;Available PRN/intermittently  Type of Home House  Home Access Stairs to enter  Entrance Stairs-Number of Steps 3  Entrance Stairs-Rails Left  Home Layout One level  Bathroom Therapist, music None  Additional Comments questionable historian at this time  Prior Function  Level of Independence Independent  Communication  Communication Expressive difficulties  Pain Assessment  Pain Assessment Faces  Faces Pain Scale 4  Pain Location headache  Pain Descriptors / Indicators Headache  Pain Intervention(s) Monitored during session;Premedicated before session;Repositioned  Cognition  Arousal/Alertness Awake/alert  Behavior During Therapy Flat affect  Overall Cognitive Status Impaired/Different from baseline  Area of Impairment Orientation;Attention;Memory;Following commands;Safety/judgement;Awareness;Problem solving  Orientation Level Disoriented to;Time;Situation   Current Attention Level Sustained  Memory Decreased short-term memory  Following Commands Follows one step commands with increased time  Safety/Judgement Decreased awareness of safety;Decreased awareness of deficits  Awareness Intellectual  Problem Solving Slow processing;Decreased initiation;Requires verbal cues;Requires tactile cues  General Comments pt attempting to put sock on R foot x2 and pt attempting to leave bathroom with water running. pt needed x2 questioning cues to problem solve turning off the water. pt getting up without help to attempt bathroom transfer. pt walking to the bathroom to void bladder with condom cath in place and lackes awareness  Upper Extremity Assessment  Upper Extremity Assessment LUE deficits/detail  LUE Deficits / Details AROM FF 100 degrees, able to open soap bottle with incr time and using L UE as a stabilizer only  LUE Coordination decreased fine motor  Lower Extremity Assessment  Lower Extremity Assessment Defer to PT evaluation  Cervical / Trunk Assessment  Cervical / Trunk Assessment Kyphotic  ADL  Overall ADL's  Needs assistance/impaired  Eating/Feeding Details (indicate cue type and reason) declines food and states "not hungry"  Grooming Wash/dry hands;Standing;Minimal assistance  Grooming Details (indicate cue type and reason) pt requires steady (A) at sink level and (A) with sequencing task  Lower Body Dressing Min guard;Sitting/lateral leans  Lower Body Dressing Details (indicate cue type and reason) pt attempting to don sock on R foot x2 . pt reports wearing socks and shoes daily at baseline.  Toilet Transfer Minimal assistance (standing)  Functional mobility during ADLs Minimal assistance (hand held (A))  General ADL Comments pt completed bed to bathroom to sink level grooming  Vision- History  Baseline Vision/History Wears glasses  Wears Glasses At all times  Patient Visual Report Other (comment)  Vision- Assessment  Vision  Assessment? Yes  Eye Alignment [facility]  Ocular Range of Motion [facility]  Tracking/Visual Pursuits Impaired - to be further tested in functional context  Visual Fields Left visual field deficit  Additional Comments pt denies diplopia. pt noted to leave out the third word of any sentence provided to read showing deficit within the L visual field. Pt does not self correct error even when reading sentence aloud loud with missing nouns. Pt needed cues to read entire sentence. Pt shows some L body inattention as well.   Perception  Perception Tested? Yes  Perception Deficits Inattention/neglect  Inattention/Neglect Does not attend to left visual field;Impaired- to be further tested in functional context  Bed Mobility  Overal bed mobility Needs Assistance  Bed Mobility Supine to Sit  Supine to sit Min assist  General bed mobility comments min asssit for stability and line management upon coming to EOB  Transfers  Overall transfer level Needs assistance  Equipment used 1 person hand held assist  Transfers Sit to/from Stand  Sit to Stand Min assist  General transfer comment min assist for stability upon coming to upright, posterior list noted initially  Balance  Overall balance assessment Needs assistance  Sitting balance-Leahy Scale Good  Standing balance-Leahy Scale Poor  Standing balance comment increased sway noted, instability present during static tasks  General Comments  General comments (skin integrity, edema, etc.) pt reports being a pack a day smoker. pt could benefit from further BEFAST education Pt with HR 56 during activity  OT - End of Session  Equipment Utilized During Treatment Gait belt  Activity Tolerance Patient tolerated treatment well  Patient left Other (comment) (with PT [facility])  Nurse Communication Mobility status;Weight bearing status  OT Assessment  OT Recommendation/Assessment Patient needs continued OT Services  OT Visit Diagnosis Unsteadiness on feet (R26.81)  OT  Problem List Decreased activity tolerance;Decreased cognition;Decreased knowledge of use of DME or AE;Decreased safety awareness;Decreased strength;Impaired balance (sitting and/or standing);Impaired vision/perception;Pain;Decreased knowledge of precautions  OT Plan  OT Frequency (ACUTE ONLY) Min 2X/week  OT Treatment/Interventions (ACUTE ONLY) Self-care/ADL training;Therapeutic exercise;Therapeutic activities;Cognitive remediation/compensation;Visual/perceptual remediation/compensation;Patient/family education;Balance training;DME and/or AE instruction  AM-PAC OT "6 Clicks" Daily Activity Outcome Measure  Help from another person eating meals? 4  Help from another person taking care of personal grooming? 3  Help from another person toileting, which includes using toliet, bedpan, or urinal? 3  Help from another person bathing (including washing, rinsing, drying)? 3  Help from another person to put on and taking off regular upper body clothing? 3  Help from another person to put on and taking off regular lower body clothing? 3  6 Click Score 19  ADL G Code Conversion CK  OT Recommendation  Recommendations for Other Services Rehab consult  Follow Up Recommendations CIR  OT Equipment None recommended by OT  Individuals Consulted  Consulted and Agree with Results and Recommendations Patient  Acute Rehab OT Goals  Patient Stated Goal none stated to OT  OT Goal Formulation With patient  Time For Goal Achievement 09/17/17  Potential to Achieve Goals Good  OT Time Calculation  OT Start Time (ACUTE ONLY) 0940  OT Stop Time (ACUTE ONLY) 1000  OT Time Calculation (min) 20 min  OT General Charges  $OT Visit 1 Visit  OT Evaluation  $OT Eval Moderate Complexity 1 Mod  Written Expression  Dominant Hand Right    Jeri Modena   OTR/L Pager: [phone] Office: [phone] .

## 2017-09-03 NOTE — Evaluation (Signed)
Speech Language Pathology Evaluation Patient Details Name: Luis Wiley MRN: [id number] DOB: 12-22-1955 Today's Date: 09/03/2017 Time:  -     Problem List:  Patient Active Problem List   Diagnosis Date Noted  . ICH (intracerebral hemorrhage) ([place]) 09/02/2017   Past Medical History: History reviewed. No pertinent past medical history. Past Surgical History: History reviewed. No pertinent surgical history. HPI:  62 yo male smoker admitted with L sided weakness and aphasia. MRI (+) R thalamas ICH with extension into IVH  pt with bradycardia PMH: L CVA Basal ganglia    Assessment / Plan / Recommendation Clinical Impression  Pt demonstrates cognitive impairment impacting attention, memory awareness, initiation. Language and speech are WNL, but pt needs repetition of basic questions, commands to respond. Needs simplificaiton of instructions. Can read at paragraph level, but does not locate pictures in left visual field even with moderate visual cues. Pt will need f/u with SLP to address cognitive function. Recommend CIR.     SLP Assessment  SLP Recommendation/Assessment: Patient needs continued Speech Lanaguage Pathology Services SLP Visit Diagnosis: Cognitive communication deficit (R41.841)    Follow Up Recommendations  Inpatient Rehab    Frequency and Duration min 2x/week  2 weeks      SLP Evaluation Cognition  Overall Cognitive Status: Impaired/Different from baseline Arousal/Alertness: Awake/alert Orientation Level: Oriented to person;Oriented to place;Disoriented to time;Oriented to situation Attention: Focused;Sustained Focused Attention: Appears intact Sustained Attention: Impaired Sustained Attention Impairment: Verbal basic;Functional basic Memory: Impaired Memory Impairment: Decreased short term memory Decreased Short Term Memory: Verbal basic;Functional basic Awareness: Impaired Awareness Impairment: Emergent impairment Problem Solving: Impaired Problem Solving  Impairment: Verbal basic;Functional basic Executive Function: Initiating Initiating: Impaired Initiating Impairment: Verbal basic       Comprehension  Auditory Comprehension Overall Auditory Comprehension: Impaired Yes/No Questions: Impaired Complex Questions: 0-24% accurate Commands: Impaired One Step Basic Commands: 75-100% accurate Two Step Basic Commands: 75-100% accurate Complex Commands: 0-24% accurate Conversation: Simple Interfering Components: Attention;Processing speed;Visual impairments Reading Comprehension Reading Status: Within funtional limits    Expression Verbal Expression Overall Verbal Expression: Appears within functional limits for tasks assessed Written Expression Dominant Hand: Right   Oral / Motor  Oral Motor/Sensory Function Overall Oral Motor/Sensory Function: Within functional limits Motor Speech Overall Motor Speech: Appears within functional limits for tasks assessed   GO                   Herbie Baltimore, MA CCC-SLP [phone]  Lynann Beaver 09/03/2017, 12:59 PM

## 2017-09-03 NOTE — Progress Notes (Signed)
STROKE TEAM PROGRESS NOTE   SUBJECTIVE (INTERVAL HISTORY) His RN is at the bedside.  Overall he feels his condition is stable. He is sleepy and lethargic but easily arousable and following commands with prompt. Complains of mild right sided HA   OBJECTIVE Temp:  [97.9 F (36.6 C)-98.6 F (37 C)] 97.9 F (36.6 C) (08/07 0800) Pulse Rate:  [59-85] 59 (08/07 0800) Cardiac Rhythm: Normal sinus rhythm (08/07 0800) Resp:  [9-34] 20 (08/07 0800) BP: (106-174)/(69-124) 113/80 (08/07 0800) SpO2:  [10 %-100 %] 99 % (08/07 0800) Weight:  [180 lb (81.6 kg)] 180 lb (81.6 kg) (08/06 2213)  No results for input(s): GLUCAP in the last 168 hours. Recent Labs  Lab 09/02/17 2134 09/02/17 2146  NA 138 140  K 3.9 4.0  CL 106 104  CO2 23  --   GLUCOSE 100* 99  BUN 12 16  CREATININE 1.50* 1.50*  CALCIUM 9.3  --    Recent Labs  Lab 09/02/17 2134  AST 24  ALT 22  ALKPHOS 104  BILITOT 0.5  PROT 7.8  ALBUMIN 3.9   Recent Labs  Lab 09/02/17 2134 09/02/17 2146  WBC 6.6  --   NEUTROABS 4.2  --   HGB 9.4* 11.6*  HCT 34.1* 34.0*  MCV 69.3*  --   PLT 372  --    No results for input(s): CKTOTAL, CKMB, CKMBINDEX, TROPONINI in the last 168 hours. Recent Labs    09/02/17 2134  LABPROT 13.3  INR 1.02   Recent Labs    09/02/17 2135  COLORURINE STRAW*  LABSPEC 1.005  PHURINE 6.0  GLUCOSEU NEGATIVE  HGBUR MODERATE*  BILIRUBINUR NEGATIVE  KETONESUR NEGATIVE  PROTEINUR NEGATIVE  NITRITE NEGATIVE  LEUKOCYTESUR NEGATIVE    No results found for: CHOL, TRIG, HDL, CHOLHDL, VLDL, LDLCALC No results found for: HGBA1C    Component Value Date/Time   LABOPIA NONE DETECTED 09/02/2017 2135   COCAINSCRNUR NONE DETECTED 09/02/2017 2135   LABBENZ NONE DETECTED 09/02/2017 2135   AMPHETMU NONE DETECTED 09/02/2017 2135   THCU NONE DETECTED 09/02/2017 2135   LABBARB NONE DETECTED 09/02/2017 2135    Recent Labs  Lab 09/02/17 2134  ETH <10    I have personally reviewed the radiological  images below and agree with the radiology interpretations.  Ct Head Wo Contrast  Result Date: 09/03/2017 CLINICAL DATA:  Intracranial hemorrhage follow up EXAM: CT HEAD WITHOUT CONTRAST TECHNIQUE: Contiguous axial images were obtained from the base of the skull through the vertex without intravenous contrast. COMPARISON:  None. FINDINGS: Brain: Intraparenchymal hemorrhage centered at the right thalamus is unchanged in size measuring 3.0 x 1.4 cm. Intraventricular extension into the lateral, third and fourth ventricles is unchanged. No new site of hemorrhage. No midline shift or mass effect. Size and configuration of the ventricles is unchanged. There is periventricular hypoattenuation compatible with chronic microvascular disease. Unchanged appearance of left thalamus lacunar infarct. Vascular: No abnormal hyperdensity of the major intracranial arteries or dural venous sinuses. No intracranial atherosclerosis. Skull: The visualized skull base, calvarium and extracranial soft tissues are normal. Sinuses/Orbits: Complete opacification of the left maxillary sinus. The orbits are normal. IMPRESSION: Unchanged appearance of intraparenchymal hematoma centered in the right thalamus with associated intraventricular extension. Electronically Signed   By: Ulyses Jarred M.D.   On: 09/03/2017 01:07   Ct Head Code Stroke Wo Contrast  Result Date: 09/02/2017 CLINICAL DATA:  Code stroke.  LEFT-sided weakness, aphasia. EXAM: CT HEAD WITHOUT CONTRAST TECHNIQUE: Contiguous axial images were obtained from  the base of the skull through the vertex without intravenous contrast. COMPARISON:  None. FINDINGS: BRAIN: 1.4 x 1.6 x 2.5 cm (volume = 2.9 cc) RIGHT thalamic intraparenchymal hematoma with minimal surrounding low-density vasogenic edema. Intraventricular blood products in the third, fourth and lateral ventricles. Regional mass effect without midline shift. No hydrocephalus. Confluent supratentorial white matter hypodensities.  Age indeterminate LEFT thalamus lacunar infarct. More chronic appearing bilateral basal ganglia lacunar infarcts. No acute large vascular territory infarct. No abnormal extra-axial fluid collections. VASCULAR: Mild calcific atherosclerosis of the carotid siphons. Dolichoectatic intracranial vessels seen with chronic hypertension. SKULL: No skull fracture. No significant scalp soft tissue swelling. SINUSES/ORBITS: Severe chronic LEFT maxillary sinusitis with mucoperiosteal reaction, mild antral expansion associated with mucoceles. Mild remaining paranasal sinus mucosal thickening. RIGHT mastoid effusion with coalescence. Included ocular globes and orbital contents are non-suspicious. OTHER: None. IMPRESSION: 1. Acute RIGHT thalamus hematoma with intraventricular extension. No midline shift or hydrocephalus. 2. Age indeterminate LEFT thalamus lacunar infarct. Chronic appearing bilateral basal ganglia lacunar infarcts. 3. Critical Value/emergent results were called by telephone at the time of interpretation on 09/02/2017 at 9:48 pm to Dr. Theotis Burrow , who verbally acknowledged these results. Critical Value/emergent results text paged to [place], Neurology via [employer] secure system on 09/02/2017 at 9:45 pm, including interpreting physician's phone number. Electronically Signed   By: Elon Alas M.D.   On: 09/02/2017 21:51    CTA head and neck pending  TTE pending   PHYSICAL EXAM  Temp:  [97.9 F (36.6 C)-98.6 F (37 C)] 97.9 F (36.6 C) (08/07 0800) Pulse Rate:  [59-85] 59 (08/07 0800) Resp:  [9-34] 20 (08/07 0800) BP: (106-174)/(69-124) 113/80 (08/07 0800) SpO2:  [10 %-100 %] 99 % (08/07 0800) Weight:  [180 lb (81.6 kg)] 180 lb (81.6 kg) (08/06 2213)  General - Well nourished, well developed, mild lethargy.  Ophthalmologic - fundi not visualized due to noncooperation.  Cardiovascular - Regular rate and rhythm.  Mental Status -  Level of arousal and orientation to place, self and person  were intact, but not orientated to time. Language including expression, naming, repetition, comprehension was assessed and found intact, but paucity of speech.  Cranial Nerves II - XII - II - Visual field intact OU. III, IV, VI - Extraocular movements intact. V - Facial sensation intact bilaterally. VII - left facial droop and absence of left forehead wrinkles with eyebrow raising up and left absence of frown. VIII - Hearing & vestibular intact bilaterally. X - Palate elevates symmetrically. XI - Chin turning & shoulder shrug intact bilaterally. XII - Tongue protrusion intact.  Motor Strength - The patient's strength was normal in RUE and RLE as well as LLE, however, LUE 4+/5 proximally and 4/5 distally and mild pronator drift was present on the left.  Bulk was normal and fasciculations were absent.   Motor Tone - Muscle tone was assessed at the neck and appendages and was normal.  Reflexes - The patient's reflexes were symmetrical in all extremities and he had no pathological reflexes.  Sensory - Light touch, temperature/pinprick were assessed and were symmetrical.    Coordination - The patient had normal movements in the right hand with no ataxia or dysmetria. However, not following commands for left hand FTN, always trying to do with right hand, seems to have left arm neglect. Tremor was absent.  Gait and Station - deferred.   ASSESSMENT/PLAN Mr. CULLEY HEDEEN is a 62 y.o. male with no significant hx admitted for left sided weakness, confusion  and vomiting x 2. No tPA given due to [place].    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   Ongoing  aggressive stroke risk factor management  Therapy recommendations:  Pending   Disposition:  Pending  Elevated Cre  Cre 1.50  On IVF  Encourage po intake  Monitoring BMP  Hypertension Stable  BP goal < 140 for now  BP 160s on presentation  Hydralazine PRN  Hyperlipidemia  Home meds:  none   LDL pending, goal < 70  Tobacco abuse  Current smoker  Smoking cessation counseling provided  Pt is willing to quit  Other Stroke Risk Factors  Advanced age  Other Active Problems  Bradycardia   Hospital day # 1  This patient is critically ill due to [place] with IVH, elevated Cre and at significant risk of neurological worsening, death form hematoma expansion, hydrocephalus, kidney failure. This patient's care requires constant monitoring of vital signs, hemodynamics, respiratory and cardiac monitoring, review of multiple databases, neurological assessment, discussion with family, other specialists and medical decision making of high complexity. I spent 40 minutes of neurocritical care time in the care of this patient.   Rosalin Hawking, MD PhD Stroke Neurology 09/03/2017 9:32 AM    To contact Stroke Continuity provider, please refer to [email or web address] After hours, contact General Neurology

## 2017-09-03 NOTE — Progress Notes (Signed)
PT Cancellation Note  Patient Details Name: Luis Wiley MRN: [id number] DOB: [birth year]   Cancelled Treatment:    Reason Eval/Treat Not Completed: Active bedrest order   Duncan Dull 09/03/2017, 9:13 AM

## 2017-09-04 ENCOUNTER — Inpatient Hospital Stay (HOSPITAL_COMMUNITY): Payer: Medicaid Other

## 2017-09-04 DIAGNOSIS — E785 Hyperlipidemia, unspecified: Secondary | ICD-10-CM

## 2017-09-04 LAB — ECHOCARDIOGRAM COMPLETE
HEIGHTINCHES: 72 in
Weight: 2880 oz

## 2017-09-04 LAB — BASIC METABOLIC PANEL
ANION GAP: 10 (ref 5–15)
BUN: 12 mg/dL (ref 8–23)
CALCIUM: 9.2 mg/dL (ref 8.9–10.3)
CO2: 25 mmol/L (ref 22–32)
Chloride: 106 mmol/L (ref 98–111)
Creatinine, Ser: 1.44 mg/dL — ABNORMAL HIGH (ref 0.61–1.24)
GFR, EST AFRICAN AMERICAN: 59 mL/min — AB (ref >60)
GFR, EST NON AFRICAN AMERICAN: 51 mL/min — AB (ref >60)
Glucose, Bld: 89 mg/dL (ref 70–99)
POTASSIUM: 3.7 mmol/L (ref 3.5–5.1)
Sodium: 141 mmol/L (ref 135–145)

## 2017-09-04 LAB — CBC
HEMATOCRIT: 31.3 % — AB (ref 39.0–52.0)
Hemoglobin: 8.6 g/dL — ABNORMAL LOW (ref 13.0–17.0)
MCH: 18.8 pg — ABNORMAL LOW (ref 26.0–34.0)
MCHC: 27.5 g/dL — ABNORMAL LOW (ref 30.0–36.0)
MCV: 68.3 fL — ABNORMAL LOW (ref 78.0–100.0)
PLATELETS: 371 10*3/uL (ref 150–400)
RBC: 4.58 MIL/uL (ref 4.22–5.81)
RDW: 19 % — AB (ref 11.5–15.5)
WBC: 5.8 10*3/uL (ref 4.0–10.5)

## 2017-09-04 LAB — LIPID PANEL
CHOL/HDL RATIO: 5.5 ratio
Cholesterol: 182 mg/dL (ref 0–200)
HDL: 33 mg/dL — AB (ref >40)
LDL Cholesterol: 131 mg/dL — ABNORMAL HIGH (ref 0–99)
TRIGLYCERIDES: 90 mg/dL (ref <150)
VLDL: 18 mg/dL (ref 0–40)

## 2017-09-04 LAB — HEMOGLOBIN A1C
Hgb A1c MFr Bld: 5.4 % (ref 4.8–5.6)
Mean Plasma Glucose: 108.28 mg/dL

## 2017-09-04 LAB — VITAMIN B12: VITAMIN B 12: 289 pg/mL (ref 180–914)

## 2017-09-04 LAB — TSH: TSH: 2.316 u[IU]/mL (ref 0.350–4.500)

## 2017-09-04 MED ORDER — HYDRALAZINE HCL 20 MG/ML IJ SOLN
5.0000 mg | INTRAMUSCULAR | Status: DC | PRN
  Administered 2017-09-05: 5 mg via INTRAVENOUS
  Filled 2017-09-04: qty 1

## 2017-09-04 NOTE — Progress Notes (Signed)
  Echocardiogram 2D Echocardiogram has been performed.  Madelaine Etienne 09/04/2017, 10:09 AM

## 2017-09-04 NOTE — Progress Notes (Signed)
STROKE TEAM PROGRESS NOTE   SUBJECTIVE (INTERVAL HISTORY) His RN is at the bedside. Pt sitting in chair. Awake alert and no complains. He still has disorientation to time and mild left UE weakness. PT/OT recommend CIR.   OBJECTIVE Temp:  [97.9 F (36.6 C)-99.9 F (37.7 C)] 98.8 F (37.1 C) (08/08 0800) Pulse Rate:  [45-84] 52 (08/08 0800) Cardiac Rhythm: Normal sinus rhythm (08/08 0800) Resp:  [4-26] 13 (08/08 0800) BP: (109-159)/(61-117) 144/99 (08/08 0807) SpO2:  [98 %-100 %] 100 % (08/08 0800)  No results for input(s): GLUCAP in the last 168 hours. Recent Labs  Lab 09/02/17 2134 09/02/17 2146 09/04/17 0356  NA 138 140 141  K 3.9 4.0 3.7  CL 106 104 106  CO2 23  --  25  GLUCOSE 100* 99 89  BUN 12 16 12   CREATININE 1.50* 1.50* 1.44*  CALCIUM 9.3  --  9.2   Recent Labs  Lab 09/02/17 2134  AST 24  ALT 22  ALKPHOS 104  BILITOT 0.5  PROT 7.8  ALBUMIN 3.9   Recent Labs  Lab 09/02/17 2134 09/02/17 2146 09/04/17 0356  WBC 6.6  --  5.8  NEUTROABS 4.2  --   --   HGB 9.4* 11.6* 8.6*  HCT 34.1* 34.0* 31.3*  MCV 69.3*  --  68.3*  PLT 372  --  371   No results for input(s): CKTOTAL, CKMB, CKMBINDEX, TROPONINI in the last 168 hours. Recent Labs    09/02/17 2134  LABPROT 13.3  INR 1.02   Recent Labs    09/02/17 2135  COLORURINE STRAW*  LABSPEC 1.005  PHURINE 6.0  GLUCOSEU NEGATIVE  HGBUR MODERATE*  BILIRUBINUR NEGATIVE  KETONESUR NEGATIVE  PROTEINUR NEGATIVE  NITRITE NEGATIVE  LEUKOCYTESUR NEGATIVE       Component Value Date/Time   CHOL 182 09/04/2017 0356   TRIG 90 09/04/2017 0356   HDL 33 (L) 09/04/2017 0356   CHOLHDL 5.5 09/04/2017 0356   VLDL 18 09/04/2017 0356   LDLCALC 131 (H) 09/04/2017 0356   Lab Results  Component Value Date   HGBA1C 5.4 09/04/2017      Component Value Date/Time   LABOPIA NONE DETECTED 09/02/2017 2135   COCAINSCRNUR NONE DETECTED 09/02/2017 2135   LABBENZ NONE DETECTED 09/02/2017 2135   AMPHETMU NONE DETECTED  09/02/2017 2135   THCU NONE DETECTED 09/02/2017 2135   LABBARB NONE DETECTED 09/02/2017 2135    Recent Labs  Lab 09/02/17 2134  ETH <10    I have personally reviewed the radiological images below and agree with the radiology interpretations.  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 Additional subcentimeter nodules or intraparotid lymph nodes bilaterally. ENT referral is recommended. Electronically Signed   By: Logan Bores M.D.   On: 09/03/2017 12:13   Result Date: 09/03/2017 CLINICAL DATA:  Intracranial hemorrhage. EXAM: CT ANGIOGRAPHY HEAD AND NECK TECHNIQUE: Multidetector CT imaging of the head and neck was performed using the standard protocol during bolus administration of intravenous contrast. Multiplanar CT image reconstructions and MIPs were obtained to evaluate the vascular anatomy. Carotid stenosis measurements (when applicable) are obtained utilizing NASCET criteria, using the distal internal carotid diameter as the denominator. CONTRAST:  26mL ISOVUE-370 IOPAMIDOL (ISOVUE-370) INJECTION 76% COMPARISON:  Noncontrast head CT 09/03/2017. No prior angiographic imaging. FINDINGS: CTA NECK FINDINGS Aortic arch: Standard 3 vessel aortic arch with mild atherosclerotic plaque. Widely patent arch  vessel origins. Right carotid system: Patent with scattered soft plaque most notable in the proximal ICA. No evidence of significant stenosis or dissection. Left carotid system: Patent with scattered soft and minimally calcified plaque most notable at the carotid bifurcation. No evidence of stenosis or dissection. Mildly ectatic appearance of the distal cervical ICA. Vertebral arteries: Patent without evidence of stenosis or dissection. Mildly dominant right vertebral artery. Skeleton: Chronic left maxillary sinusitis with a completely opacified and mildly expanded sinus which  may reflect an underlying mucocele. Partial left anterior ethmoid air cell opacification. Small right mastoid effusion. Advanced cervical disc degeneration and facet arthrosis. Other neck: 2.3 x 1.9 cm mildly heterogeneously enhancing mass in the superficial left parotid gland. Additional subcentimeter soft tissue nodules or lymph nodes in both parotid glands. Upper chest: Mild centrilobular emphysema. Review of the MIP images confirms the above findings CTA HEAD FINDINGS Anterior circulation: The internal carotid arteries are patent from skull base to carotid termini with diffuse atherosclerotic luminal irregularity but no evidence of flow limiting stenosis. ACAs and MCAs are patent with widespread irregularity. There are mild stenoses of the right ACA and right MCA origins with additional mild M1 and moderate M2 stenoses bilaterally. No aneurysm is identified. Posterior circulation: The intracranial vertebral arteries are patent to the basilar with moderate atherosclerotic irregularity but no significant stenosis. There is prominent diffuse irregularity of the basilar artery without significant stenosis. Patent posterior communicating arteries are present bilaterally. The PCAs are irregular diffusely with severe proximal right P2 and moderate proximal left P2 stenoses as well as additional severe branch vessel stenoses more distally bilaterally. Allowing for widespread posterior circulation atherosclerotic vessel irregularity and ectasias, no sizable saccular aneurysm or vascular malformation is identified. Venous sinuses: Patent. Anatomic variants: None. Delayed phase: Similar appearance of right thalamic hemorrhage measuring approximately 2.6 x 1.7 cm and of small volume blood throughout the ventricles compared to today's earlier CT. No abnormal enhancement identified. Review of the MIP images confirms the above findings IMPRESSION: 1. Widespread intracranial atherosclerosis without large vessel occlusion. 2.  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 3. Mild right A1 and bilateral M1 stenoses. 4. Mild cervical carotid artery atherosclerosis without stenosis. 5. 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 6. Aortic Atherosclerosis (ICD10-I70.0) and Emphysema (ICD10-J43.9). Electronically Signed: By: Logan Bores M.D. On: 09/03/2017 10:57   Ct Head Wo Contrast  Result Date: 09/03/2017 CLINICAL DATA:  Intracranial hemorrhage follow up EXAM: CT HEAD WITHOUT CONTRAST TECHNIQUE: Contiguous axial images were obtained from the base of the skull through the vertex without intravenous contrast. COMPARISON:  None. FINDINGS: Brain: Intraparenchymal hemorrhage centered at the right thalamus is unchanged in size measuring 3.0 x 1.4 cm. Intraventricular extension into the lateral, third and fourth ventricles is unchanged. No new site of hemorrhage. No midline shift or mass effect. Size and configuration of the ventricles is unchanged. There is periventricular hypoattenuation compatible with chronic microvascular disease. Unchanged appearance of left thalamus lacunar infarct. Vascular: No abnormal hyperdensity of the major intracranial arteries or dural venous sinuses. No intracranial atherosclerosis. Skull: The visualized skull base, calvarium and extracranial soft tissues are normal. Sinuses/Orbits: Complete opacification of the left maxillary sinus. The orbits are normal. IMPRESSION: Unchanged appearance of intraparenchymal hematoma centered in the right thalamus with associated intraventricular extension. Electronically Signed   By: Ulyses Jarred M.D.   On: 09/03/2017 01:07   Ct Angio Neck W Or Wo Contrast  Addendum Date: 09/03/2017   ADDENDUM REPORT: 09/03/2017 12:13 ADDENDUM: 2.3 cm left parotid  mass likely reflecting primary parotid neoplasm. Additional subcentimeter nodules or intraparotid lymph nodes bilaterally. ENT referral is recommended. Electronically Signed   By: Logan Bores M.D.    On: 09/03/2017 12:13   Result Date: 09/03/2017 CLINICAL DATA:  Intracranial hemorrhage. EXAM: CT ANGIOGRAPHY HEAD AND NECK TECHNIQUE: Multidetector CT imaging of the head and neck was performed using the standard protocol during bolus administration of intravenous contrast. Multiplanar CT image reconstructions and MIPs were obtained to evaluate the vascular anatomy. Carotid stenosis measurements (when applicable) are obtained utilizing NASCET criteria, using the distal internal carotid diameter as the denominator. CONTRAST:  9mL ISOVUE-370 IOPAMIDOL (ISOVUE-370) INJECTION 76% COMPARISON:  Noncontrast head CT 09/03/2017. No prior angiographic imaging. FINDINGS: CTA NECK FINDINGS Aortic arch: Standard 3 vessel aortic arch with mild atherosclerotic plaque. Widely patent arch vessel origins. Right carotid system: Patent with scattered soft plaque most notable in the proximal ICA. No evidence of significant stenosis or dissection. Left carotid system: Patent with scattered soft and minimally calcified plaque most notable at the carotid bifurcation. No evidence of stenosis or dissection. Mildly ectatic appearance of the distal cervical ICA. Vertebral arteries: Patent without evidence of stenosis or dissection. Mildly dominant right vertebral artery. Skeleton: Chronic left maxillary sinusitis with a completely opacified and mildly expanded sinus which may reflect an underlying mucocele. Partial left anterior ethmoid air cell opacification. Small right mastoid effusion. Advanced cervical disc degeneration and facet arthrosis. Other neck: 2.3 x 1.9 cm mildly heterogeneously enhancing mass in the superficial left parotid gland. Additional subcentimeter soft tissue nodules or lymph nodes in both parotid glands. Upper chest: Mild centrilobular emphysema. Review of the MIP images confirms the above findings CTA HEAD FINDINGS Anterior circulation: The internal carotid arteries are patent from skull base to carotid termini  with diffuse atherosclerotic luminal irregularity but no evidence of flow limiting stenosis. ACAs and MCAs are patent with widespread irregularity. There are mild stenoses of the right ACA and right MCA origins with additional mild M1 and moderate M2 stenoses bilaterally. No aneurysm is identified. Posterior circulation: The intracranial vertebral arteries are patent to the basilar with moderate atherosclerotic irregularity but no significant stenosis. There is prominent diffuse irregularity of the basilar artery without significant stenosis. Patent posterior communicating arteries are present bilaterally. The PCAs are irregular diffusely with severe proximal right P2 and moderate proximal left P2 stenoses as well as additional severe branch vessel stenoses more distally bilaterally. Allowing for widespread posterior circulation atherosclerotic vessel irregularity and ectasias, no sizable saccular aneurysm or vascular malformation is identified. Venous sinuses: Patent. Anatomic variants: None. Delayed phase: Similar appearance of right thalamic hemorrhage measuring approximately 2.6 x 1.7 cm and of small volume blood throughout the ventricles compared to today's earlier CT. No abnormal enhancement identified. Review of the MIP images confirms the above findings IMPRESSION: 1. Widespread intracranial atherosclerosis without large vessel occlusion. 2. 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 3. Mild right A1 and bilateral M1 stenoses. 4. Mild cervical carotid artery atherosclerosis without stenosis. 5. 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 6. Aortic Atherosclerosis (ICD10-I70.0) and Emphysema (ICD10-J43.9). Electronically Signed: By: Logan Bores M.D. On: 09/03/2017 10:57   Ct Head Code Stroke Wo Contrast  Result Date: 09/02/2017 CLINICAL DATA:  Code stroke.  LEFT-sided weakness, aphasia. EXAM: CT HEAD WITHOUT CONTRAST TECHNIQUE: Contiguous axial images were obtained from  the base of the skull through the vertex without intravenous contrast. COMPARISON:  None. FINDINGS: BRAIN: 1.4 x 1.6 x 2.5 cm (volume = 2.9 cc) RIGHT thalamic intraparenchymal  hematoma with minimal surrounding low-density vasogenic edema. Intraventricular blood products in the third, fourth and lateral ventricles. Regional mass effect without midline shift. No hydrocephalus. Confluent supratentorial white matter hypodensities. Age indeterminate LEFT thalamus lacunar infarct. More chronic appearing bilateral basal ganglia lacunar infarcts. No acute large vascular territory infarct. No abnormal extra-axial fluid collections. VASCULAR: Mild calcific atherosclerosis of the carotid siphons. Dolichoectatic intracranial vessels seen with chronic hypertension. SKULL: No skull fracture. No significant scalp soft tissue swelling. SINUSES/ORBITS: Severe chronic LEFT maxillary sinusitis with mucoperiosteal reaction, mild antral expansion associated with mucoceles. Mild remaining paranasal sinus mucosal thickening. RIGHT mastoid effusion with coalescence. Included ocular globes and orbital contents are non-suspicious. OTHER: None. IMPRESSION: 1. Acute RIGHT thalamus hematoma with intraventricular extension. No midline shift or hydrocephalus. 2. Age indeterminate LEFT thalamus lacunar infarct. Chronic appearing bilateral basal ganglia lacunar infarcts. 3. Critical Value/emergent results were called by telephone at the time of interpretation on 09/02/2017 at 9:48 pm to Dr. Theotis Burrow , who verbally acknowledged these results. Critical Value/emergent results text paged to [place], Neurology via [employer] secure system on 09/02/2017 at 9:45 pm, including interpreting physician's phone number. Electronically Signed   By: Elon Alas M.D.   On: 09/02/2017 21:51   TTE pending   PHYSICAL EXAM  Temp:  [97.9 F (36.6 C)-99.9 F (37.7 C)] 98.8 F (37.1 C) (08/08 0800) Pulse Rate:  [45-84] 52 (08/08 0800) Resp:  [4-26] 13  (08/08 0800) BP: (109-159)/(61-117) 144/99 (08/08 0807) SpO2:  [98 %-100 %] 100 % (08/08 0800)  General - Well nourished, well developed, mild lethargy.  Ophthalmologic - fundi not visualized due to noncooperation.  Cardiovascular - Regular rate and rhythm.  Mental Status -  Level of arousal and orientation to place, self and person were intact, but not orientated to time. Language including expression, naming, repetition, comprehension was assessed and found intact, but paucity of speech.  Cranial Nerves II - XII - II - Visual field intact OU. III, IV, VI - Extraocular movements intact. V - Facial sensation intact bilaterally. VII - mild left facial droop and absence of left forehead wrinkles with eyebrow raising up and left absence of frown. VIII - Hearing & vestibular intact bilaterally. X - Palate elevates symmetrically. XI - Chin turning & shoulder shrug intact bilaterally. XII - Tongue protrusion intact.  Motor Strength - The patient's strength was normal in RUE and RLE as well as LLE, however, LUE 4/5 and mild pronator drift.  Bulk was normal and fasciculations were absent.   Motor Tone - Muscle tone was assessed at the neck and appendages and was normal.  Reflexes - The patient's reflexes were symmetrical in all extremities and he had no pathological reflexes.  Sensory - Light touch, temperature/pinprick were assessed and were symmetrical.    Coordination - The patient had normal movements in the hands with no ataxia or dysmetria.  Tremor was absent.  Gait and Station - deferred.   ASSESSMENT/PLAN Mr. Luis Wiley is a 62 y.o. male with no significant hx admitted for left sided weakness, confusion and vomiting x 2. No tPA given due to [place].    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 This patient's care requires constant monitoring of vital signs, hemodynamics, respiratory and cardiac monitoring, review of multiple databases, neurological assessment, discussion with family, other specialists and medical decision making of high complexity. I spent 35 minutes of neurocritical care time in the care of this patient.   Rosalin Hawking, MD PhD Stroke Neurology 09/04/2017 9:55 AM    To contact Stroke Continuity provider, please refer to [email or web address] After hours, contact General Neurology

## 2017-09-04 NOTE — Progress Notes (Signed)
Occupational Therapy Treatment Patient Details Name: Luis Wiley MRN: [id number] DOB: 1955/03/08 Today's Date: 09/04/2017    History of present illness 62 yo male smoker admitted with L sided weakness and aphasia. MRI (+) R thalamas ICH with extension into IVH  pt with bradycardia PMH: L CVA Basal ganglia    OT comments  This 62 yo male seen today to work on balance and vision. Pt still with left inattention and decreased vision on left (appears more upper quadrant). Pt showing decreased safety awareness and problem solving with getting up to bathroom without calling for A, pulling wires off of him as he walked to bathroom, and urinating on floor but unable to problem solve what to do next. He will benefit from CIR but will still need 24 hour S/prn A.   Follow Up Recommendations  CIR;Supervision/Assistance - 24 hour    Equipment Recommendations  Other (comment)(TBD at next venue)       Precautions / Restrictions Precautions Precautions: Fall Restrictions Weight Bearing Restrictions: No       Mobility Bed Mobility               General bed mobility comments: pt up in bathroom staring at wall upon my arrival  Transfers Overall transfer level: Needs assistance Equipment used: None   Sit to Stand: Min guard              Balance               Standing balance comment: No balance issues noted with coming back into room from bathroom                           ADL either performed or assessed with clinical judgement   ADL Overall ADL's : Needs assistance/impaired                       Lower Body Dressing Details (indicate cue type and reason): Pt with wet socks due to urinating on them. I doffed them for him since they were wet. Handed pt a sock and he donned on right foot (taking over a minute to only get it half way on while bending forward so I cued him to cross his legs and he then got it the rest of the way on no problem), handed pt  other sock and he immediiately tried to don on same right foot-VC to don on left foot, pt kept trying to put on right foot. Had to take his hands with sock in it and move it to his left foot then he donned it in same manner as right (needing VCs to cross leg to finish) Toilet Transfer: Min guard;Ambulation                   Vision Baseline Vision/History: Wears glasses Wears Glasses: At all times Additional Comments: Had several items on table in front of pt. He was able to find all of them except on in left superior field, then needed VCs x2 more times to find the same object          Cognition Arousal/Alertness: Awake/alert Behavior During Therapy: Flat affect Overall Cognitive Status: Impaired/Different from baseline Area of Impairment: Orientation;Attention;Following commands;Safety/judgement;Problem solving                 Orientation Level: (initally could not tell me where he was, but at end of session he was able to) Current  Attention Level: Sustained   Following Commands: Follows one step commands inconsistently Safety/Judgement: Decreased awareness of safety;Decreased awareness of deficits   Problem Solving: Decreased initiation;Difficulty sequencing;Requires verbal cues;Requires tactile cues General Comments: Found pt up in bathroom by himself with 3 leads disconnected from telemetry box still hooked up on wall unit, bottom of gown he was wearing rolled up in his hand and linen bag in his hand with urine all over floor from recliner into bathroom staring at wall. Chair alarm not on (RN reports pt has been setting alarm off alot and he is stedy on his feet so they have been letting him get up)                   Pertinent Vitals/ Pain       Pain Assessment: No/denies pain         Frequency  Min 2X/week        Progress Toward Goals  OT Goals(current goals can now be found in the care plan section)  Progress towards OT goals: Progressing toward  goals     Plan Discharge plan remains appropriate       AM-PAC PT "6 Clicks" Daily Activity     Outcome Measure   Help from another person eating meals?: A Little Help from another person taking care of personal grooming?: A Little Help from another person toileting, which includes using toliet, bedpan, or urinal?: A Little Help from another person bathing (including washing, rinsing, drying)?: A Little Help from another person to put on and taking off regular upper body clothing?: A Little Help from another person to put on and taking off regular lower body clothing?: A Little 6 Click Score: 18    End of Session    OT Visit Diagnosis: Unsteadiness on feet (R26.81);Other symptoms and signs involving cognitive function   Activity Tolerance Patient tolerated treatment well   Patient Left in chair;with call bell/phone within reach;with chair alarm set   Nurse Communication (alarm set in recliner, pt incontient, my concern about letting pt get up by himself if he is having urination accidents and leads are attached to wall unit)        Time: 2919-1660 OT Time Calculation (min): 19 min  Charges: OT General Charges $OT Visit: 1 Visit OT Treatments $Therapeutic Activity: 8-22 mins  [place], OTR/L [phone] 09/04/2017

## 2017-09-05 ENCOUNTER — Other Ambulatory Visit: Payer: Self-pay

## 2017-09-05 ENCOUNTER — Encounter (HOSPITAL_COMMUNITY): Payer: Self-pay

## 2017-09-05 ENCOUNTER — Inpatient Hospital Stay (HOSPITAL_COMMUNITY)
Admission: RE | Admit: 2017-09-05 | Discharge: 2017-09-12 | DRG: 057 | Disposition: A | Discharge status: Home / Self Care | Payer: Medicaid Other | Source: Intra-hospital | Attending: Physical Medicine & Rehabilitation | Admitting: Physical Medicine & Rehabilitation

## 2017-09-05 DIAGNOSIS — Z7982 Long term (current) use of aspirin: Secondary | ICD-10-CM | POA: Diagnosis not present

## 2017-09-05 DIAGNOSIS — F17210 Nicotine dependence, cigarettes, uncomplicated: Secondary | ICD-10-CM | POA: Diagnosis present

## 2017-09-05 DIAGNOSIS — I129 Hypertensive chronic kidney disease with stage 1 through stage 4 chronic kidney disease, or unspecified chronic kidney disease: Secondary | ICD-10-CM | POA: Diagnosis present

## 2017-09-05 DIAGNOSIS — I69398 Other sequelae of cerebral infarction: Secondary | ICD-10-CM | POA: Diagnosis not present

## 2017-09-05 DIAGNOSIS — I69154 Hemiplegia and hemiparesis following nontraumatic intracerebral hemorrhage affecting left non-dominant side: Secondary | ICD-10-CM | POA: Diagnosis present

## 2017-09-05 DIAGNOSIS — D509 Iron deficiency anemia, unspecified: Secondary | ICD-10-CM | POA: Diagnosis present

## 2017-09-05 DIAGNOSIS — Z720 Tobacco use: Secondary | ICD-10-CM

## 2017-09-05 DIAGNOSIS — Z716 Tobacco abuse counseling: Secondary | ICD-10-CM

## 2017-09-05 DIAGNOSIS — N183 Chronic kidney disease, stage 3 unspecified: Secondary | ICD-10-CM

## 2017-09-05 DIAGNOSIS — Z9119 Patient's noncompliance with other medical treatment and regimen: Secondary | ICD-10-CM

## 2017-09-05 DIAGNOSIS — I672 Cerebral atherosclerosis: Secondary | ICD-10-CM | POA: Diagnosis present

## 2017-09-05 DIAGNOSIS — R4182 Altered mental status, unspecified: Secondary | ICD-10-CM | POA: Diagnosis present

## 2017-09-05 DIAGNOSIS — I69319 Unspecified symptoms and signs involving cognitive functions following cerebral infarction: Secondary | ICD-10-CM | POA: Diagnosis not present

## 2017-09-05 DIAGNOSIS — D649 Anemia, unspecified: Secondary | ICD-10-CM

## 2017-09-05 DIAGNOSIS — I10 Essential (primary) hypertension: Secondary | ICD-10-CM

## 2017-09-05 DIAGNOSIS — I610 Nontraumatic intracerebral hemorrhage in hemisphere, subcortical: Secondary | ICD-10-CM | POA: Diagnosis present

## 2017-09-05 DIAGNOSIS — I69120 Aphasia following nontraumatic intracerebral hemorrhage: Secondary | ICD-10-CM | POA: Diagnosis not present

## 2017-09-05 LAB — CBC
HEMATOCRIT: 30.3 % — AB (ref 39.0–52.0)
HEMOGLOBIN: 8.4 g/dL — AB (ref 13.0–17.0)
MCH: 19 pg — AB (ref 26.0–34.0)
MCHC: 27.7 g/dL — AB (ref 30.0–36.0)
MCV: 68.6 fL — AB (ref 78.0–100.0)
Platelets: 297 10*3/uL (ref 150–400)
RBC: 4.42 MIL/uL (ref 4.22–5.81)
RDW: 18.8 % — ABNORMAL HIGH (ref 11.5–15.5)
WBC: 5.5 10*3/uL (ref 4.0–10.5)

## 2017-09-05 LAB — BASIC METABOLIC PANEL
Anion gap: 13 (ref 5–15)
BUN: 16 mg/dL (ref 8–23)
CO2: 22 mmol/L (ref 22–32)
CREATININE: 1.47 mg/dL — AB (ref 0.61–1.24)
Calcium: 9.4 mg/dL (ref 8.9–10.3)
Chloride: 106 mmol/L (ref 98–111)
GFR calc Af Amer: 57 mL/min — ABNORMAL LOW (ref >60)
GFR calc non Af Amer: 49 mL/min — ABNORMAL LOW (ref >60)
Glucose, Bld: 92 mg/dL (ref 70–99)
POTASSIUM: 3.6 mmol/L (ref 3.5–5.1)
Sodium: 141 mmol/L (ref 135–145)

## 2017-09-05 MED ORDER — LISINOPRIL 20 MG PO TABS: 20.0000 mg | ORAL_TABLET | Freq: Every day | ORAL | Status: DC

## 2017-09-05 MED ORDER — PANTOPRAZOLE SODIUM 40 MG PO TBEC
40.0000 mg | DELAYED_RELEASE_TABLET | Freq: Every day | ORAL | Status: DC
  Administered 2017-09-06 – 2017-09-12 (×7): 40 mg via ORAL
  Filled 2017-09-05 (×7): qty 1

## 2017-09-05 MED ORDER — ATORVASTATIN CALCIUM 10 MG PO TABS: 20.0000 mg | ORAL_TABLET | Freq: Every day | ORAL | Status: DC

## 2017-09-05 MED ORDER — AMLODIPINE BESYLATE 10 MG PO TABS: 10.0000 mg | ORAL_TABLET | Freq: Every day | ORAL | Status: DC

## 2017-09-05 MED ORDER — ATORVASTATIN CALCIUM 20 MG PO TABS
20.0000 mg | ORAL_TABLET | Freq: Every day | ORAL | Status: DC
  Administered 2017-09-06 – 2017-09-11 (×5): 20 mg via ORAL
  Filled 2017-09-05 (×5): qty 1

## 2017-09-05 MED ORDER — SENNOSIDES-DOCUSATE SODIUM 8.6-50 MG PO TABS
1.0000 | ORAL_TABLET | Freq: Two times a day (BID) | ORAL | Status: DC
  Administered 2017-09-05 – 2017-09-12 (×14): 1 via ORAL
  Filled 2017-09-05 (×14): qty 1

## 2017-09-05 MED ORDER — ACETAMINOPHEN 325 MG PO TABS
650.0000 mg | ORAL_TABLET | ORAL | Status: DC | PRN
  Administered 2017-09-06 – 2017-09-07 (×3): 650 mg via ORAL
  Filled 2017-09-05 (×3): qty 2

## 2017-09-05 MED ORDER — LISINOPRIL 20 MG PO TABS
20.0000 mg | ORAL_TABLET | Freq: Every day | ORAL | Status: DC
  Administered 2017-09-06: 20 mg via ORAL
  Filled 2017-09-05: qty 1

## 2017-09-05 NOTE — Progress Notes (Signed)
Inpatient Rehabilitation-Admissions Coordinator    Met with patient and his sister, [name], at the bedside to discuss team's recommendation for inpatient rehabilitation. Shared booklets, expectations while in CIR, expected length of stay, and anticipated functional level at DC. AC reviewed daily cost of care as pt is currently uninsured. Family aware that there is no guarantee the pt will qualify for Medicaid; Family still wanting to proceed with CIR. Pt has been in contact with financial counselor Toniann Ket.   Pt agreeable to CIR at this time. AC has bed available and will admit pt today; AC has received medical approval for admit today.   Jhonnie Garner, OTR/L  Rehab Admissions Coordinator  [phone] 09/05/2017 5:44 PM

## 2017-09-05 NOTE — Progress Notes (Signed)
Report received from [place], Therapist, sports. Transferred from 4N to [id number]. Reviewed documentation and agree with all assessment charted.

## 2017-09-05 NOTE — PMR Pre-admission (Signed)
PMR Admission Coordinator Pre-Admission Assessment  Patient: Luis Wiley is an 62 y.o., male MRN: [id number] DOB: [birth year] Height: 6' (182.9 cm) Weight: 81.8 kg              Insurance Information HMO:     PPO:      PCP:      IPA:      80/20:      OTHER:  PRIMARY: Uninsured.      Policy#:       Subscriber:  CM Name:       Phone#:      Fax#:  Pre-Cert#:       Employer:  Benefits:  Phone #:      Name:  Eff. Date:      Deduct:       Out of Pocket Max:       Life Max:  CIR:       SNF:  Outpatient:      Co-Pay:  Home Health:       Co-Pay:  DME:      Co-Pay:  Providers:  SECONDARY:       Policy#:  Subscriber:  CM Name:      Phone#:      Fax#:  Pre-Cert#:       Employer:  Benefits:  Phone #:      Name:  Eff. Date:      Deduct:       Out of Pocket Max:       Life Max:  CIR:       SNF:  Outpatient:      Co-Pay:  Home Health:       Co-Pay:  DME:      Co-Pay:   Medicaid Application Date:       Case Manager:  Disability Application Date:       Case Worker:  *Family is currently working with Toniann Ket with financial counseling [phone]   Emergency Contact Information Contact Information    Name Relation Home Work Mobile   Marlane Hatcher Sister   [phone]     Current Medical History  Patient Admitting Diagnosis: right thalamic hemorrhage with left hemiparesis History of Present Illness: Per CT evidence of 62 year old right-handed male history of tobacco abuse.  Per chart review he lives with his sister.  One level home with 3 steps to entry.  Independent prior to admission.  Presented 09/02/2017 with left-sided weakness and altered mental status with aphasia.  Blood pressure 132 systolic per EMS.  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  No midline shift or hydrocephalus.  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  Patient did receive TPA.  CT angiogram of head and neck showed widespread intracranial atherosclerosis  without large vessel occlusion.  Echocardiogram with ejection fraction of 44% grade 1 diastolic dysfunction.  Creatinine mildly elevated on admission 1.50 felt to be baseline and monitored.  Neurology follow-up advise conservative care close monitoring of blood pressure.  Tolerating a regular diet.  Physical and occupational therapy evaluations completed with recommendations of physical medicine rehab consult.  Patient is to be admitted for a comprehensive rehab program on 09/05/17. Complete NIHSS TOTAL: 4    Past Medical History  History reviewed. No pertinent past medical history.  Family History  family history includes Hypertension in his mother.  Prior Rehab/Hospitalizations:  Has the patient had major surgery during 100 days prior to admission? No  Current Medications   Current Facility-Administered Medications:  .  acetaminophen (TYLENOL)  tablet 650 mg, 650 mg, Oral, Q4H PRN, 650 mg at 09/04/17 0951 **OR** [DISCONTINUED] acetaminophen (TYLENOL) solution 650 mg, 650 mg, Per Tube, Q4H PRN **OR** [DISCONTINUED] acetaminophen (TYLENOL) suppository 650 mg, 650 mg, Rectal, Q4H PRN, Aroor, Karena Addison R, MD .  atorvastatin (LIPITOR) tablet 20 mg, 20 mg, Oral, q1800, Rosalin Hawking, MD .  hydrALAZINE (APRESOLINE) injection 5-10 mg, 5-10 mg, Intravenous, Q4H PRN, Rosalin Hawking, MD, 5 mg at 09/05/17 1552 .  lisinopril (PRINIVIL,ZESTRIL) tablet 20 mg, 20 mg, Oral, Daily, Rosalin Hawking, MD .  pantoprazole (PROTONIX) EC tablet 40 mg, 40 mg, Oral, Daily, Rosalin Hawking, MD, 40 mg at 09/05/17 1013 .  senna-docusate (Senokot-S) tablet 1 tablet, 1 tablet, Oral, BID, Aroor, Lanice Schwab, MD, 1 tablet at 09/05/17 1013  Patients Current Diet:  Diet Order            Diet Heart Room service appropriate? 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 None of the above  Prior Functional Level Prior Function Level of Independence: Independent  Self Care: Did the patient need help bathing, dressing, using the toilet or eating?  Independent  Indoor Mobility: Did the patient need assistance with walking from room to room (with or without device)? Independent  Stairs: Did the patient need assistance with internal or external stairs (with or without device)? Independent  Functional Cognition: Did the patient need help planning regular tasks such as shopping or remembering to take medications? Independent  Current Functional Level Cognition  Arousal/Alertness: Awake/alert Overall Cognitive Status: Impaired/Different from baseline Current Attention Level: Sustained Orientation Level: Oriented to person, Oriented to place, Disoriented to time, Oriented to situation Following Commands: Follows one step commands with increased time Safety/Judgement: Decreased awareness of safety, Decreased awareness of deficits General Comments: pt continues to have difficulty problem solving and with L side inattention needing almost constant cues for awareness of L side; pt has little insight into deficits Attention: Focused, Sustained Focused Attention: Appears intact Sustained Attention: Impaired Sustained Attention Impairment: Verbal basic, Functional basic Memory: Impaired Memory Impairment: Decreased short term memory Decreased Short Term Memory: Verbal basic, Functional basic Awareness: Impaired Awareness Impairment: Emergent impairment Problem Solving: Impaired Problem Solving Impairment: Verbal  basic, Functional basic Executive Function: Initiating Initiating: Impaired Initiating Impairment: Verbal basic    Extremity Assessment (includes Sensation/Coordination)  Upper Extremity Assessment: LUE deficits/detail LUE Deficits / Details: AROM FF 100 degrees, able to open soap bottle with incr time and using L UE as a stabilizer only LUE Coordination: decreased fine motor  Lower Extremity Assessment: Defer to PT evaluation LLE Deficits / Details: noted LLE modest weakness and coordination difficulty ? related to poor awareness LLE Coordination: decreased gross motor    ADLs  Overall ADL's : Needs assistance/impaired Eating/Feeding Details (indicate cue type and reason): declines food and states "not hungry" Grooming: Wash/dry hands, Standing, Minimal assistance Grooming Details (indicate cue type and reason): pt requires steady (A) at sink level and (A) with sequencing task Lower Body Dressing: Min guard,  Sitting/lateral leans Lower Body Dressing Details (indicate cue type and reason): Pt with wet socks due to urinating on them. I doffed them for him since they were wet. Handed pt a sock and he donned on right foot (taking over a minute to only get it half way on while bending forward so I cued him to cross his legs and he then got it the rest of the way on no problem), handed pt other sock and he immediiately tried to don on same right foot-VC to don on left foot, pt kept trying to put on right foot. Had to take his hands with sock in it and move it to his left foot then he donned it in same manner as right (needing VCs to cross leg to finish) Toilet Transfer: Min guard, Ambulation Functional mobility during ADLs: Minimal assistance(hand held (A)) General ADL Comments: pt completed bed to bathroom to sink level grooming    Mobility  Overal bed mobility: Needs Assistance Bed Mobility: Supine to Sit Supine to sit: Min assist General bed mobility comments: pt in recliner upon arrival      Transfers  Overall transfer level: Needs assistance Equipment used: None Transfers: Sit to/from Stand Sit to Stand: Min guard General transfer comment: min guard for safety    Ambulation / Gait / Stairs / Wheelchair Mobility  Ambulation/Gait Ambulation/Gait assistance: Min assist, Mod assist Gait Distance (Feet): 150 Feet Assistive device: (assist for balance at trunk with use of gait belt) Gait Pattern/deviations: Step-through pattern, Decreased stride length, Decreased step length - left, Drifts right/left, Decreased dorsiflexion - right, Decreased dorsiflexion - left, Wide base of support General Gait Details: pt with unsteady gait and with LOB requiring assistance to recover; pt with L LE lag and with difficulty with step through pattern; pt runs into objects on L side and with extremely slow gait speed Gait velocity: decreased Gait velocity interpretation: <1.31 ft/sec, indicative of household ambulator    Posture / Balance Balance Overall balance assessment: Needs assistance Sitting balance-Leahy Scale: Good Standing balance-Leahy Scale: Poor Standing balance comment: No balance issues noted with coming back into room from bathroom High level balance activites: Direction changes, Turns, Head turns High Level Balance Comments: patient with poor awareness of environment during attempts at higher level tasks. Continuously running in to objects on left with left drift noted    Special needs/care consideration BiPAP/CPAP: no  CPM: no Continuous Drip IV: no Dialysis: no        Days: no Life Vest: no Oxygen: no  Special Bed: no Trach Size: no Wound Vac (area): no      Location: no Skin: No areas of concern                              Location Bowel mgmt: No BM record Bladder mgmt: typically continent per family Diabetic mgmt: NA     Previous Home Environment Living Arrangements: Other relatives(with sister) Available Help at Discharge: Family, Available  PRN/intermittently Type of Home: House Home Layout: One level Home Access: Stairs to enter Entrance Stairs-Rails: Left Entrance Stairs-Number of Steps: 3 Bathroom Shower/Tub: Chiropodist: Standard Home Care Services: No Additional Comments: questionable historian at this time  Discharge Living Setting Plans for Discharge Living Setting: Patient's home, House, Lives with (comment)(lives with sister) Type of Home at Discharge: House Discharge Home Layout: One level Discharge Home Access: Stairs to enter Entrance Stairs-Rails: Left Entrance Stairs-Number of Steps: 3  steps Discharge Bathroom Shower/Tub: Tub/shower unit Discharge Bathroom Toilet: Standard Discharge Bathroom Accessibility: Yes How Accessible: Accessible via walker Does the patient have any problems obtaining your medications?: No  Social/Family/Support Systems Patient Roles: Other (Comment)(lives with sister) Contact Information: sister is emergency contact Anticipated Caregiver: Sister: [name] Anticipated Caregiver's Contact Information: [name]: [phone] Ability/Limitations of Caregiver: Caregiver works during the day; goals are for [employer] I Caregiver Availability: Evenings only Discharge Plan Discussed with Primary Caregiver: Yes Is Caregiver In Agreement with Plan?: Yes Does Caregiver/Family have Issues with Lodging/Transportation while Pt is in Rehab?: No   Goals/Additional Needs Patient/Family Goal for Rehab: PT/OT/SLP: Mod I  Expected length of stay: 7 days Cultural Considerations: NA Dietary Needs: heart health, thin liquids Equipment Needs: TBD Special Service Needs: NA Pt/Family Agrees to Admission and willing to participate: Yes(pt and sister) Program Orientation Provided & Reviewed with Pt/Caregiver Including Roles  & Responsibilities: Yes(pt and sister)  Barriers to Discharge: Home environment access/layout, Lack of/limited family support  Barriers to Discharge Comments: 3  steps, sister works during the day   Decrease burden of Care through IP rehab admission: NA   Possible need for SNF placement upon discharge:Not anticipated   Patient Condition: This patient's condition remains as documented in the consult dated 09/05/17, in which the Rehabilitation Physician determined and documented that the patient's condition is appropriate for intensive rehabilitative care in an inpatient rehabilitation facility. Will admit to inpatient rehab today.  Preadmission Screen Completed By:  Jhonnie Garner, 09/05/2017 5:29 PM ______________________________________________________________________   Discussed status with Dr. Naaman Plummer on 09/05/17 at 5:37PM and received telephone approval for admission today.  Admission Coordinator:  Jhonnie Garner, time 5:37PM/Date 09/05/17

## 2017-09-05 NOTE — Progress Notes (Signed)
Pt transferred to inpatient rehab. Report given. Ivs remain intact.   Rishikesh Khachatryan, Mervin Kung RN

## 2017-09-05 NOTE — H&P (Signed)
Physical Medicine and Rehabilitation Admission H&P    Chief Complaint  Patient presents with  . Code Stroke  : HPI: Per CT evidence of 62 year old right-handed male history of tobacco abuse.  Per chart review he lives with his sister.  One level home with 3 steps to entry.  Independent prior to admission.  Presented 09/02/2017 with left-sided weakness and altered mental status with aphasia.  Blood pressure 093 systolic per EMS.  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  No midline shift or hydrocephalus.  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  Patient did receive TPA.  CT angiogram of head and neck showed widespread intracranial atherosclerosis without large vessel occlusion.  Echocardiogram with ejection fraction of 26% grade 1 diastolic dysfunction.  Creatinine mildly elevated on admission 1.50 felt to be baseline and monitored.  Neurology follow-up advise conservative care close monitoring of blood pressure.  Tolerating a regular diet.  Physical and occupational therapy evaluations completed with recommendations of physical medicine rehab consult.  Patient was admitted for a comprehensive rehab program.  Review of Systems  Constitutional: Negative for weight loss.       Denies fever chills  HENT: Negative for ear pain.        Negative hearing loss  Eyes: Negative for photophobia.       Denies blurred vision  Respiratory: Negative for sputum production.   Cardiovascular: Negative for claudication.       No chest pain or shortness of breath  Gastrointestinal: Negative for vomiting.  Genitourinary: Negative for frequency.  Musculoskeletal: Negative for myalgias.  Skin: Negative for rash.  Neurological: Positive for dizziness and focal weakness.       Focal weakness with speech change.  Denies dizziness or headache  Psychiatric/Behavioral: Negative for depression.  All other systems reviewed and are negative.  History reviewed.  No pertinent past medical history. History reviewed. No pertinent surgical history. Family History  Problem Relation Age of Onset  . Hypertension Mother    Social History:  reports that he has been smoking cigarettes. He does not have any smokeless tobacco history on file. His alcohol and drug histories are not on file. Allergies: Not on File Medications Prior to Admission  Medication Sig Dispense Refill  . acetaminophen (TYLENOL) 500 MG tablet Take 500 mg by mouth every 6 (six) hours as needed for headache.    Marland Kitchen aspirin EC 81 MG tablet Take 81 mg by mouth daily.      Drug Regimen Review Drug regimen was reviewed and remains appropriate with no significant issues identified  Home: Home Living Family/patient expects to be discharged to:: Private residence Living Arrangements: Other relatives(with sister) Available Help at Discharge: Family, Available PRN/intermittently Type of Home: House Home Access: Stairs to enter Technical brewer of Steps: 3 Entrance Stairs-Rails: Left Home Layout: One level Bathroom Shower/Tub: Chiropodist: Standard Home Equipment: None Additional Comments: questionable historian at this time   Functional History: Prior Function Level of Independence: Independent  Functional Status:  Mobility: Bed Mobility Overal bed mobility: Needs Assistance Bed Mobility: Supine to Sit Supine to sit: Min assist General bed mobility comments: pt in recliner upon arrival  Transfers Overall transfer level: Needs assistance Equipment used: None Transfers: Sit to/from Stand Sit to Stand: Min guard General transfer comment: min guard for safety Ambulation/Gait Ambulation/Gait assistance: Min assist, Mod assist Gait Distance (Feet): 150 Feet Assistive device: (assist for balance at trunk with use of gait belt) Gait Pattern/deviations: Step-through pattern, Decreased  stride length, Decreased step length - left, Drifts right/left, Decreased  dorsiflexion - right, Decreased dorsiflexion - left, Wide base of support General Gait Details: pt with unsteady gait and with LOB requiring assistance to recover; pt with L LE lag and with difficulty with step through pattern; pt runs into objects on L side and with extremely slow gait speed Gait velocity: decreased Gait velocity interpretation: <1.31 ft/sec, indicative of household ambulator    ADL: ADL Overall ADL's : Needs assistance/impaired Eating/Feeding Details (indicate cue type and reason): declines food and states "not hungry" Grooming: Wash/dry hands, Standing, Minimal assistance Grooming Details (indicate cue type and reason): pt requires steady (A) at sink level and (A) with sequencing task Lower Body Dressing: Min guard, Sitting/lateral leans Lower Body Dressing Details (indicate cue type and reason): Pt with wet socks due to urinating on them. I doffed them for him since they were wet. Handed pt a sock and he donned on right foot (taking over a minute to only get it half way on while bending forward so I cued him to cross his legs and he then got it the rest of the way on no problem), handed pt other sock and he immediiately tried to don on same right foot-VC to don on left foot, pt kept trying to put on right foot. Had to take his hands with sock in it and move it to his left foot then he donned it in same manner as right (needing VCs to cross leg to finish) Toilet Transfer: Min guard, Ambulation Functional mobility during ADLs: Minimal assistance(hand held (A)) General ADL Comments: pt completed bed to bathroom to sink level grooming  Cognition: Cognition Overall Cognitive Status: Impaired/Different from baseline Arousal/Alertness: Awake/alert Orientation Level: Oriented to person, Oriented to place, Disoriented to time, Oriented to situation Attention: Focused, Sustained Focused Attention: Appears intact Sustained Attention: Impaired Sustained Attention Impairment: Verbal  basic, Functional basic Memory: Impaired Memory Impairment: Decreased short term memory Decreased Short Term Memory: Verbal basic, Functional basic Awareness: Impaired Awareness Impairment: Emergent impairment Problem Solving: Impaired Problem Solving Impairment: Verbal basic, Functional basic Executive Function: Initiating Initiating: Impaired Initiating Impairment: Verbal basic Cognition Arousal/Alertness: Awake/alert Behavior During Therapy: WFL for tasks assessed/performed Overall Cognitive Status: Impaired/Different from baseline Area of Impairment: Attention, Following commands, Safety/judgement, Problem solving, Memory Orientation Level: (initally could not tell me where he was, but at end of session he was able to) Current Attention Level: Sustained Memory: Decreased short-term memory Following Commands: Follows one step commands with increased time Safety/Judgement: Decreased awareness of safety, Decreased awareness of deficits Awareness: Intellectual Problem Solving: Decreased initiation, Difficulty sequencing, Requires verbal cues, Slow processing General Comments: pt continues to have difficulty problem solving and with L side inattention needing almost constant cues for awareness of L side; pt has little insight into deficits  Physical Exam: Blood pressure (!) 172/109, pulse 60, temperature 98.2 F (36.8 C), temperature source Oral, resp. rate 20, height 6' (1.829 m), weight 81.8 kg, SpO2 98 %. Physical Exam  Constitutional: No distress.  HENT:  Head: Normocephalic.     Eyes: Pupils are equal, round, and reactive to light. EOM are normal.     Neck: Normal range of motion. Neck supple. No JVD present. No tracheal deviation present. No thyromegaly present.     Cardiovascular: Normal rate and regular rhythm. Exam reveals no friction rub.  No murmur heard.    Respiratory: Effort normal. No respiratory distress. He has no wheezes. He has no rales.     GI:  Abdomen  soft nontender  Neurological:  Patient is alert with limited attention.  Provides his name and age.  Needed multiple cues for biographical information. Word finding deficits.  Follows simple commands. LUE 3+ to 4/5 prox to distal. LLE 4/5 prox to distal. RUE and RLE 5/5. Sensory function within normal limits.   Skin: Skin is warm. He is not diaphoretic.  Psychiatric: He has a normal mood and affect. His behavior is normal.    Results for orders placed or performed during the hospital encounter of 09/02/17 (from the past 48 hour(s))  CBC     Status: Abnormal   Collection Time: 09/04/17  3:56 AM  Result Value Ref Range   WBC 5.8 4.0 - 10.5 K/uL   RBC 4.58 4.22 - 5.81 MIL/uL   Hemoglobin 8.6 (L) 13.0 - 17.0 g/dL    Comment: REPEATED TO VERIFY   HCT 31.3 (L) 39.0 - 52.0 %   MCV 68.3 (L) 78.0 - 100.0 fL   MCH 18.8 (L) 26.0 - 34.0 pg   MCHC 27.5 (L) 30.0 - 36.0 g/dL   RDW 19.0 (H) 11.5 - 15.5 %   Platelets 371 150 - 400 K/uL    Comment: Performed at [facility], [address] [place], [place] [address]  Basic metabolic panel     Status: Abnormal   Collection Time: 09/04/17  3:56 AM  Result Value Ref Range   Sodium 141 135 - 145 mmol/L   Potassium 3.7 3.5 - 5.1 mmol/L   Chloride 106 98 - 111 mmol/L   CO2 25 22 - 32 mmol/L   Glucose, Bld 89 70 - 99 mg/dL   BUN 12 8 - 23 mg/dL   Creatinine, Ser 1.44 (H) 0.61 - 1.24 mg/dL   Calcium 9.2 8.9 - 10.3 mg/dL   GFR calc non Af Amer 51 (L) >60 mL/min   GFR calc Af Amer 59 (L) >60 mL/min    Comment: (NOTE) The eGFR has been calculated using the CKD EPI equation. This calculation has not been validated in all clinical situations. eGFR's persistently <60 mL/min signify possible Chronic Kidney Disease.    Anion gap 10 5 - 15    Comment: Performed at [facility] [address] [place], [place] [address]  Lipid panel     Status: Abnormal   Collection Time: 09/04/17  3:56 AM  Result Value Ref Range   Cholesterol 182 0 - 200  mg/dL   Triglycerides 90 <150 mg/dL   HDL 33 (L) >40 mg/dL   Total CHOL/HDL Ratio 5.5 RATIO   VLDL 18 0 - 40 mg/dL   LDL Cholesterol 131 (H) 0 - 99 mg/dL    Comment:        Total Cholesterol/HDL:CHD Risk Coronary Heart Disease Risk Table                     Men   Women  1/2 Average Risk   3.4   3.3  Average Risk       5.0   4.4  2 X Average Risk   9.6   7.1  3 X Average Risk  23.4   11.0        Use the calculated Patient Ratio above and the CHD Risk Table to determine the patient's CHD Risk.        ATP III CLASSIFICATION (LDL):  <100     mg/dL   Optimal  100-129  mg/dL   Near or  Above                    Optimal  130-159  mg/dL   Borderline  160-189  mg/dL   High  >190     mg/dL   Very High Performed at [facility] [address] [place], [place] [address]   Hemoglobin A1c     Status: None   Collection Time: 09/04/17  3:56 AM  Result Value Ref Range   Hgb A1c MFr Bld 5.4 4.8 - 5.6 %    Comment: (NOTE) Pre diabetes:          5.7%-6.4% Diabetes:              >6.4% Glycemic control for   <7.0% adults with diabetes    Mean Plasma Glucose 108.28 mg/dL    Comment: Performed at [facility] [address], [place], [place] [address]  TSH     Status: None   Collection Time: 09/04/17  3:56 AM  Result Value Ref Range   TSH 2.316 0.350 - 4.500 uIU/mL    Comment: Performed by a 3rd Generation assay with a functional sensitivity of <=0.01 uIU/mL. Performed at [facility], [place] [address] [place], [place] [address]   Vitamin B12     Status: None   Collection Time: 09/04/17  3:56 AM  Result Value Ref Range   Vitamin B-12 289 180 - 914 pg/mL    Comment: (NOTE) This assay is not validated for testing neonatal or myeloproliferative syndrome specimens for Vitamin B12 levels. Performed at [facility], [place] [address], [place], [place] [address]   CBC     Status: Abnormal   Collection Time: 09/05/17  5:01 AM  Result Value Ref Range   WBC 5.5 4.0 -  10.5 K/uL   RBC 4.42 4.22 - 5.81 MIL/uL   Hemoglobin 8.4 (L) 13.0 - 17.0 g/dL   HCT 30.3 (L) 39.0 - 52.0 %   MCV 68.6 (L) 78.0 - 100.0 fL   MCH 19.0 (L) 26.0 - 34.0 pg   MCHC 27.7 (L) 30.0 - 36.0 g/dL   RDW 18.8 (H) 11.5 - 15.5 %   Platelets 297 150 - 400 K/uL    Comment: Performed at [facility], [place] [address], [place], [place] [address]  Basic metabolic panel     Status: Abnormal   Collection Time: 09/05/17  5:01 AM  Result Value Ref Range   Sodium 141 135 - 145 mmol/L   Potassium 3.6 3.5 - 5.1 mmol/L   Chloride 106 98 - 111 mmol/L   CO2 22 22 - 32 mmol/L   Glucose, Bld 92 70 - 99 mg/dL   BUN 16 8 - 23 mg/dL   Creatinine, Ser 1.47 (H) 0.61 - 1.24 mg/dL   Calcium 9.4 8.9 - 10.3 mg/dL   GFR calc non Af Amer 49 (L) >60 mL/min   GFR calc Af Amer 57 (L) >60 mL/min    Comment: (NOTE) The eGFR has been calculated using the CKD EPI equation. This calculation has not been validated in all clinical situations. eGFR's persistently <60 mL/min signify possible Chronic Kidney Disease.    Anion gap 13 5 - 15    Comment: Performed at [facility] [address], [place], [place] [address]   No results found.     Medical Problem List and Plan: 1.  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  DVT Prophylaxis/Anticoagulation: SCDs.  Monitor for any signs of DVT 3. Pain Management: Tylenol as needed 4. Mood: Provide emotional support 5. Neuropsych: This patient is capable of making decisions on his own behalf. 6. Skin/Wound Care: Routine skin checks 7. Fluids/Electrolytes/Nutrition: Routine in and outs with follow-up chemistries 8.  Hypertension.  Monitor with increased mobility. VS per routine 9.  Tobacco abuse.  Counseling 10.  CKD stage II.  Follow-up chemistries upon admit  -encourage fluids      Cathlyn Parsons, PA-C 09/05/2017

## 2017-09-05 NOTE — Progress Notes (Signed)
Physical Therapy Treatment Patient Details Name: Luis Wiley MRN: [id number] DOB: 1955/10/13 Today's Date: 09/05/2017    History of Present Illness 62 yo male smoker admitted with L sided weakness and aphasia. MRI (+) R thalamas ICH with extension into IVH  pt with bradycardia PMH: L CVA Basal ganglia     PT Comments    Patient requires assistance for OOB mobility given his significant gait deviations and impaired balance and cognition. Pt has little insight into deficits and remains a fall risk. Pt presents with L side weakness and inattention and tends to maintain L UE flexion synergy pattern while mobilizing. Continue to recommend CIR for comprehensive skilled therapies.    Follow Up Recommendations  CIR;Supervision/Assistance - 24 hour     Equipment Recommendations  None recommended by PT    Recommendations for Other Services       Precautions / Restrictions Precautions Precautions: Fall    Mobility  Bed Mobility               General bed mobility comments: pt in recliner upon arrival   Transfers Overall transfer level: Needs assistance Equipment used: None Transfers: Sit to/from Stand Sit to Stand: Min guard         General transfer comment: min guard for safety  Ambulation/Gait Ambulation/Gait assistance: Min assist;Mod assist Gait Distance (Feet): 150 Feet Assistive device: (assist for balance at trunk with use of gait belt) Gait Pattern/deviations: Step-through pattern;Decreased stride length;Decreased step length - left;Drifts right/left;Decreased dorsiflexion - right;Decreased dorsiflexion - left;Wide base of support Gait velocity: decreased Gait velocity interpretation: <1.31 ft/sec, indicative of household ambulator General Gait Details: pt with unsteady gait and with LOB requiring assistance to recover; pt with L LE lag and with difficulty with step through pattern; pt runs into objects on L side and with extremely slow gait speed   Stairs              Wheelchair Mobility    Modified Rankin (Stroke Patients Only) Modified Rankin (Stroke Patients Only) Pre-Morbid Rankin Score: No symptoms Modified Rankin: Moderately severe disability     Balance Overall balance assessment: Needs assistance   Sitting balance-Leahy Scale: Good       Standing balance-Leahy Scale: Poor                              Cognition Arousal/Alertness: Awake/alert Behavior During Therapy: WFL for tasks assessed/performed Overall Cognitive Status: Impaired/Different from baseline Area of Impairment: Attention;Following commands;Safety/judgement;Problem solving;Memory                   Current Attention Level: Sustained Memory: Decreased short-term memory Following Commands: Follows one step commands with increased time Safety/Judgement: Decreased awareness of safety;Decreased awareness of deficits Awareness: Intellectual Problem Solving: Decreased initiation;Difficulty sequencing;Requires verbal cues;Slow processing General Comments: pt continues to have difficulty problem solving and with L side inattention needing almost constant cues for awareness of L side; pt has little insight into deficits      Exercises      General Comments General comments (skin integrity, edema, etc.): pt maintains L UE in flexion synergy pattern; attempted high level balance activities however pt needs increased time to follow cues and easily distracted by environment      Pertinent Vitals/Pain Pain Assessment: No/denies pain    Home Living                      Prior Function  PT Goals (current goals can now be found in the care plan section) Acute Rehab PT Goals PT Goal Formulation: With patient/family Time For Goal Achievement: 09/17/17 Potential to Achieve Goals: Good Progress towards PT goals: Progressing toward goals    Frequency    Min 3X/week      PT Plan Current plan remains appropriate     Co-evaluation              AM-PAC PT "6 Clicks" Daily Activity  Outcome Measure  Difficulty turning over in bed (including adjusting bedclothes, sheets and blankets)?: A Little Difficulty moving from lying on back to sitting on the side of the bed? : Unable Difficulty sitting down on and standing up from a chair with arms (e.g., wheelchair, bedside commode, etc,.)?: Unable Help needed moving to and from a bed to chair (including a wheelchair)?: A Little Help needed walking in hospital room?: A Little Help needed climbing 3-5 steps with a railing? : A Lot 6 Click Score: 13    End of Session Equipment Utilized During Treatment: Gait belt Activity Tolerance: Patient tolerated treatment well Patient left: in chair;with call bell/phone within reach;with chair alarm set Nurse Communication: Mobility status PT Visit Diagnosis: Unsteadiness on feet (R26.81);Other symptoms and signs involving the nervous system (R29.898)     Time: 1116-1140 PT Time Calculation (min) (ACUTE ONLY): 24 min  Charges:  $Gait Training: 8-22 mins $Therapeutic Activity: 8-22 mins                     Earney Navy, PTA Pager: [phone]     Darliss Cheney 09/05/2017, 11:52 AM

## 2017-09-05 NOTE — Consult Note (Signed)
Physical Medicine and Rehabilitation Consult Reason for Consult: Left-sided weakness and aphasia Referring Physician: Dr.Xu   HPI: Luis Wiley is a 62 y.o. right-handed male with history of tobacco abuse.  Per chart review patient lives with sister.  One level home with 3 steps to entry.  Independent prior to admission.  Presented 09/02/2017 with left-sided weakness altered mental status and aphasia.  Blood pressure 240 systolic per EMS.  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  No midline shift or hydrocephalus.  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  Patient did receive TPA.  CT angiogram of head and neck showed widespread intracranial atherosclerosis without large vessel occlusion.  Echocardiogram with ejection fraction of 97% grade 1 diastolic dysfunction.  Creatinine mildly elevated on admission 1.50 felt to be baseline.  Neurology follow-up with conservative care close monitoring of blood pressure.  Tolerating a regular diet.  Therapy evaluations completed with recommendations of physical medicine rehab consult.   Review of Systems  Constitutional: Negative for fever.       No fever or chills  HENT: Negative for tinnitus.   Eyes: Negative for pain.  Respiratory: Negative for hemoptysis.   Cardiovascular: Negative for claudication.       Negative chest pain or shortness of breath  Gastrointestinal: Negative for abdominal pain.  Genitourinary: Negative for urgency.  Skin: Negative for rash.  Neurological: Positive for focal weakness and weakness.       Negative seizures  Endo/Heme/Allergies: Negative for environmental allergies.  Psychiatric/Behavioral: Negative for depression.  All other systems reviewed and are negative.  History reviewed. No pertinent past medical history. History reviewed. No pertinent surgical history. Family History  Problem Relation Age of Onset  . Hypertension Mother    Social History:   reports that he has been smoking cigarettes. He does not have any smokeless tobacco history on file. His alcohol and drug histories are not on file. Allergies: Not on File Medications Prior to Admission  Medication Sig Dispense Refill  . acetaminophen (TYLENOL) 500 MG tablet Take 500 mg by mouth every 6 (six) hours as needed for headache.    Marland Kitchen aspirin EC 81 MG tablet Take 81 mg by mouth daily.      Home: Home Living Family/patient expects to be discharged to:: Private residence Living Arrangements: Other relatives(with sister) Available Help at Discharge: Family, Available PRN/intermittently Type of Home: House Home Access: Stairs to enter Technical brewer of Steps: 3 Entrance Stairs-Rails: Left Home Layout: One level Bathroom Shower/Tub: Chiropodist: Lee: None Additional Comments: questionable historian at this time  Functional History: Prior Function Level of Independence: Independent Functional Status:  Mobility: Bed Mobility Overal bed mobility: Needs Assistance Bed Mobility: Supine to Sit Supine to sit: Min assist General bed mobility comments: pt up in bathroom staring at wall upon my arrival Transfers Overall transfer level: Needs assistance Equipment used: None Transfers: Sit to/from Stand Sit to Stand: Min guard General transfer comment: min assist for stability upon coming to upright, posterior list noted initially Ambulation/Gait Ambulation/Gait assistance: Min assist Gait Distance (Feet): 100 Feet Assistive device: 1 person hand held assist Gait Pattern/deviations: Step-through pattern, Decreased stride length, Decreased step length - left, Drifts right/left General Gait Details: patient with noted instability, and poor awareness of environment. Patient with increased left LE lag at times causing patient to lose balance requiring increased physical assist. Hands on min assist at all times for basic level  ambulation Gait velocity: decreased  ADL: ADL Overall ADL's : Needs assistance/impaired Eating/Feeding Details (indicate cue type and reason): declines food and states "not hungry" Grooming: Wash/dry hands, Standing, Minimal assistance Grooming Details (indicate cue type and reason): pt requires steady (A) at sink level and (A) with sequencing task Lower Body Dressing: Min guard, Sitting/lateral leans Lower Body Dressing Details (indicate cue type and reason): Pt with wet socks due to urinating on them. I doffed them for him since they were wet. Handed pt a sock and he donned on right foot (taking over a minute to only get it half way on while bending forward so I cued him to cross his legs and he then got it the rest of the way on no problem), handed pt other sock and he immediiately tried to don on same right foot-VC to don on left foot, pt kept trying to put on right foot. Had to take his hands with sock in it and move it to his left foot then he donned it in same manner as right (needing VCs to cross leg to finish) Toilet Transfer: Min guard, Ambulation Functional mobility during ADLs: Minimal assistance(hand held (A)) General ADL Comments: pt completed bed to bathroom to sink level grooming  Cognition: Cognition Overall Cognitive Status: Impaired/Different from baseline Arousal/Alertness: Awake/alert Orientation Level: Oriented to person, Oriented to place, Disoriented to time, Oriented to situation Attention: Focused, Sustained Focused Attention: Appears intact Sustained Attention: Impaired Sustained Attention Impairment: Verbal basic, Functional basic Memory: Impaired Memory Impairment: Decreased short term memory Decreased Short Term Memory: Verbal basic, Functional basic Awareness: Impaired Awareness Impairment: Emergent impairment Problem Solving: Impaired Problem Solving Impairment: Verbal basic, Functional basic Executive Function: Initiating Initiating:  Impaired Initiating Impairment: Verbal basic Cognition Arousal/Alertness: Awake/alert Behavior During Therapy: Flat affect Overall Cognitive Status: Impaired/Different from baseline Area of Impairment: Orientation, Attention, Following commands, Safety/judgement, Problem solving Orientation Level: (initally could not tell me where he was, but at end of session he was able to) Current Attention Level: Sustained Memory: Decreased short-term memory Following Commands: Follows one step commands inconsistently Safety/Judgement: Decreased awareness of safety, Decreased awareness of deficits Awareness: Intellectual Problem Solving: Decreased initiation, Difficulty sequencing, Requires verbal cues, Requires tactile cues General Comments: Found pt up in bathroom by himself with 3 leads disconnected from telemetry box still hooked up on wall unit, bottom of gown he was wearing rolled up in his hand and linen bag in his hand with urine all over floor from recliner into bathroom staring at wall. Chair alarm not on (RN reports pt has been setting alarm off alot and he is stedy on his feet so they have been letting him get up)  Blood pressure (!) 178/111, pulse 65, temperature 97.8 F (36.6 C), temperature source Oral, resp. rate 18, height 6' (1.829 m), weight 81.8 kg, SpO2 95 %. Physical Exam  Vitals reviewed. Constitutional: No distress.  HENT:  Head: Normocephalic.  Normocephalic.  Eyes: Pupils are equal, round, and reactive to light. Conjunctivae are normal.  Neck: Normal range of motion.  Cardiovascular: Normal rate.  Respiratory: Effort normal.  Musculoskeletal: Normal range of motion.  Neurological:  Patient is alert with limited attention.  He can provide his date of birth but not place.  Needed multiple cues to answer biographical information and limited medical historian. Word finding deficits. LUE 3+ to 4-/5 prox to distal. LLE: 4/5 prox to distal. Senses pain and LT in all 4's.   Skin:  He is not diaphoretic.    No results found for this or any  previous visit (from the past 24 hour(s)). 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 Additional subcentimeter nodules or intraparotid lymph nodes bilaterally. ENT referral is recommended. Electronically Signed   By: Logan Bores M.D.   On: 09/03/2017 12:13   Result Date: 09/03/2017 CLINICAL DATA:  Intracranial hemorrhage. EXAM: CT ANGIOGRAPHY HEAD AND NECK TECHNIQUE: Multidetector CT imaging of the head and neck was performed using the standard protocol during bolus administration of intravenous contrast. Multiplanar CT image reconstructions and MIPs were obtained to evaluate the vascular anatomy. Carotid stenosis measurements (when applicable) are obtained utilizing NASCET criteria, using the distal internal carotid diameter as the denominator. CONTRAST:  43mL ISOVUE-370 IOPAMIDOL (ISOVUE-370) INJECTION 76% COMPARISON:  Noncontrast head CT 09/03/2017. No prior angiographic imaging. FINDINGS: CTA NECK FINDINGS Aortic arch: Standard 3 vessel aortic arch with mild atherosclerotic plaque. Widely patent arch vessel origins. Right carotid system: Patent with scattered soft plaque most notable in the proximal ICA. No evidence of significant stenosis or dissection. Left carotid system: Patent with scattered soft and minimally calcified plaque most notable at the carotid bifurcation. No evidence of stenosis or dissection. Mildly ectatic appearance of the distal cervical ICA. Vertebral arteries: Patent without evidence of stenosis or dissection. Mildly dominant right vertebral artery. Skeleton: Chronic left maxillary sinusitis with a completely opacified and mildly expanded sinus which may reflect an underlying mucocele. Partial left anterior ethmoid air cell opacification. Small right mastoid effusion. Advanced cervical disc  degeneration and facet arthrosis. Other neck: 2.3 x 1.9 cm mildly heterogeneously enhancing mass in the superficial left parotid gland. Additional subcentimeter soft tissue nodules or lymph nodes in both parotid glands. Upper chest: Mild centrilobular emphysema. Review of the MIP images confirms the above findings CTA HEAD FINDINGS Anterior circulation: The internal carotid arteries are patent from skull base to carotid termini with diffuse atherosclerotic luminal irregularity but no evidence of flow limiting stenosis. ACAs and MCAs are patent with widespread irregularity. There are mild stenoses of the right ACA and right MCA origins with additional mild M1 and moderate M2 stenoses bilaterally. No aneurysm is identified. Posterior circulation: The intracranial vertebral arteries are patent to the basilar with moderate atherosclerotic irregularity but no significant stenosis. There is prominent diffuse irregularity of the basilar artery without significant stenosis. Patent posterior communicating arteries are present bilaterally. The PCAs are irregular diffusely with severe proximal right P2 and moderate proximal left P2 stenoses as well as additional severe branch vessel stenoses more distally bilaterally. Allowing for widespread posterior circulation atherosclerotic vessel irregularity and ectasias, no sizable saccular aneurysm or vascular malformation is identified. Venous sinuses: Patent. Anatomic variants: None. Delayed phase: Similar appearance of right thalamic hemorrhage measuring approximately 2.6 x 1.7 cm and of small volume blood throughout the ventricles compared to today's earlier CT. No abnormal enhancement identified. Review of the MIP images confirms the above findings IMPRESSION: 1. Widespread intracranial atherosclerosis without large vessel occlusion. 2. 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 3. Mild right A1 and bilateral M1 stenoses. 4. Mild cervical carotid artery atherosclerosis  without stenosis. 5. 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 6. Aortic Atherosclerosis (ICD10-I70.0) and Emphysema (ICD10-J43.9). Electronically Signed: By: Logan Bores M.D. 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 Additional subcentimeter nodules or  intraparotid lymph nodes bilaterally. ENT referral is recommended. Electronically Signed   By: Logan Bores M.D.   On: 09/03/2017 12:13   Result Date: 09/03/2017 CLINICAL DATA:  Intracranial hemorrhage. EXAM: CT ANGIOGRAPHY HEAD AND NECK TECHNIQUE: Multidetector CT imaging of the head and neck was performed using the standard protocol during bolus administration of intravenous contrast. Multiplanar CT image reconstructions and MIPs were obtained to evaluate the vascular anatomy. Carotid stenosis measurements (when applicable) are obtained utilizing NASCET criteria, using the distal internal carotid diameter as the denominator. CONTRAST:  80mL ISOVUE-370 IOPAMIDOL (ISOVUE-370) INJECTION 76% COMPARISON:  Noncontrast head CT 09/03/2017. No prior angiographic imaging. FINDINGS: CTA NECK FINDINGS Aortic arch: Standard 3 vessel aortic arch with mild atherosclerotic plaque. Widely patent arch vessel origins. Right carotid system: Patent with scattered soft plaque most notable in the proximal ICA. No evidence of significant stenosis or dissection. Left carotid system: Patent with scattered soft and minimally calcified plaque most notable at the carotid bifurcation. No evidence of stenosis or dissection. Mildly ectatic appearance of the distal cervical ICA. Vertebral arteries: Patent without evidence of stenosis or dissection. Mildly dominant right vertebral artery. Skeleton: Chronic left maxillary sinusitis with a completely opacified and mildly expanded sinus which may reflect an underlying  mucocele. Partial left anterior ethmoid air cell opacification. Small right mastoid effusion. Advanced cervical disc degeneration and facet arthrosis. Other neck: 2.3 x 1.9 cm mildly heterogeneously enhancing mass in the superficial left parotid gland. Additional subcentimeter soft tissue nodules or lymph nodes in both parotid glands. Upper chest: Mild centrilobular emphysema. Review of the MIP images confirms the above findings CTA HEAD FINDINGS Anterior circulation: The internal carotid arteries are patent from skull base to carotid termini with diffuse atherosclerotic luminal irregularity but no evidence of flow limiting stenosis. ACAs and MCAs are patent with widespread irregularity. There are mild stenoses of the right ACA and right MCA origins with additional mild M1 and moderate M2 stenoses bilaterally. No aneurysm is identified. Posterior circulation: The intracranial vertebral arteries are patent to the basilar with moderate atherosclerotic irregularity but no significant stenosis. There is prominent diffuse irregularity of the basilar artery without significant stenosis. Patent posterior communicating arteries are present bilaterally. The PCAs are irregular diffusely with severe proximal right P2 and moderate proximal left P2 stenoses as well as additional severe branch vessel stenoses more distally bilaterally. Allowing for widespread posterior circulation atherosclerotic vessel irregularity and ectasias, no sizable saccular aneurysm or vascular malformation is identified. Venous sinuses: Patent. Anatomic variants: None. Delayed phase: Similar appearance of right thalamic hemorrhage measuring approximately 2.6 x 1.7 cm and of small volume blood throughout the ventricles compared to today's earlier CT. No abnormal enhancement identified. Review of the MIP images confirms the above findings IMPRESSION: 1. Widespread intracranial atherosclerosis without large vessel occlusion. 2. Bilateral proximal P2  stenoses, severe on the right. 3. Mild right A1 and bilateral M1 stenoses. 4. Mild cervical carotid artery atherosclerosis without stenosis. 5. 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 6. Aortic Atherosclerosis (ICD10-I70.0) and Emphysema (ICD10-J43.9). Electronically Signed: By: Logan Bores M.D. On: 09/03/2017 10:57     Assessment/Plan: Diagnosis: right thalamic hemorrhage with left hemiparesis 1. Does the need for close, 24 hr/day medical supervision in concert with the patient's rehab needs make it unreasonable for this patient to be served in a less intensive setting? Yes 2. Co-Morbidities requiring supervision/potential complications: HTN, post-IVH sequelae 3. Due to bladder management, bowel management, safety, skin/wound care, disease management, medication administration, pain management and patient education, does the  patient require 24 hr/day rehab nursing? Yes 4. Does the patient require coordinated care of a physician, rehab nurse, PT (1-2 hrs/day, 5 days/week), OT (1-2 hrs/day, 5 days/week) and SLP (1-2 hrs/day, 5 days/week) to address physical and functional deficits in the context of the above medical diagnosis(es)? Yes Addressing deficits in the following areas: balance, endurance, locomotion, strength, transferring, bowel/bladder control, bathing, dressing, feeding, grooming, toileting, cognition, speech, language and psychosocial support 5. Can the patient actively participate in an intensive therapy program of at least 3 hrs of therapy per day at least 5 days per week? Yes 6. The potential for patient to make measurable gains while on inpatient rehab is excellent 7. Anticipated functional outcomes upon discharge from inpatient rehab are modified independent  with PT, modified independent with OT, modified independent with SLP. 8. Estimated rehab length of stay to reach the above functional goals is: 7 days 9. Anticipated D/C setting:  Home 10. Anticipated post D/C treatments: [place] therapy 11. Overall Rehab/Functional Prognosis: excellent  RECOMMENDATIONS: This patient's condition is appropriate for continued rehabilitative care in the following setting: CIR Patient has agreed to participate in recommended program. Yes Note that insurance prior authorization may be required for reimbursement for recommended care.  Comment: Rehab Admissions Coordinator to follow up.  Thanks,  Meredith Staggers, MD, Mellody Drown  I have personally performed a face to face diagnostic evaluation of this patient. Additionally, I have reviewed and concur with the physician assistant's documentation above.    Lavon Paganini Angiulli, PA-C 09/05/2017

## 2017-09-05 NOTE — Care Management Note (Signed)
Case Management Note  Patient Details  Name: Luis Wiley MRN: [id number] Date of Birth: [birth year]  Subjective/Objective:   Pt admitted with ICH. He lives at home with his sister and brother in Sports coach. Both work during the day but the brother in laws mother is at the home in the daytime.                  Action/Plan: CIR is the recommendation. [name], pts sister, is to be at [facility] today after 4 pm. Financial counseling and CIR updated on this information and provided CIR with [name]'s phone number: [phone]. CM following for d/c disposition.  Expected Discharge Date:                  Expected Discharge Plan:  [place]  In-House Referral:     Discharge planning Services  CM Consult  Post Acute Care Choice:    Choice offered to:     DME Arranged:    DME Agency:     HH Arranged:    [place] Agency:     Status of Service:  In process, will continue to follow  If discussed at Long Length of Stay Meetings, dates discussed:    Additional Comments:  Pollie Friar, RN 09/05/2017, 2:34 PM

## 2017-09-05 NOTE — Progress Notes (Signed)
Physical Medicine and Rehabilitation Consult Reason for Consult: Left-sided weakness and aphasia Referring Physician: Dr.Xu   HPI: Luis Wiley is a 62 y.o. right-handed male with history of tobacco abuse.  Per chart review patient lives with sister.  One level home with 3 steps to entry.  Independent prior to admission.  Presented 09/02/2017 with left-sided weakness altered mental status and aphasia.  Blood pressure 160 systolic per EMS.  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  No midline shift or hydrocephalus.  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  Patient did receive TPA.  CT angiogram of head and neck showed widespread intracranial atherosclerosis without large vessel occlusion.  Echocardiogram with ejection fraction of 73% grade 1 diastolic dysfunction.  Creatinine mildly elevated on admission 1.50 felt to be baseline.  Neurology follow-up with conservative care close monitoring of blood pressure.  Tolerating a regular diet.  Therapy evaluations completed with recommendations of physical medicine rehab consult.   Review of Systems  Constitutional: Negative for fever.       No fever or chills  HENT: Negative for tinnitus.   Eyes: Negative for pain.  Respiratory: Negative for hemoptysis.   Cardiovascular: Negative for claudication.       Negative chest pain or shortness of breath  Gastrointestinal: Negative for abdominal pain.  Genitourinary: Negative for urgency.  Skin: Negative for rash.  Neurological: Positive for focal weakness and weakness.       Negative seizures  Endo/Heme/Allergies: Negative for environmental allergies.  Psychiatric/Behavioral: Negative for depression.  All other systems reviewed and are negative.  History reviewed. No pertinent past medical history. History reviewed. No pertinent surgical history.      Family History  Problem Relation Age of Onset  . Hypertension Mother    Social History:   reports that he has been smoking cigarettes. He does not have any smokeless tobacco history on file. His alcohol and drug histories are not on file. Allergies: Not on File       Medications Prior to Admission  Medication Sig Dispense Refill  . acetaminophen (TYLENOL) 500 MG tablet Take 500 mg by mouth every 6 (six) hours as needed for headache.    Marland Kitchen aspirin EC 81 MG tablet Take 81 mg by mouth daily.      Home: Home Living Family/patient expects to be discharged to:: Private residence Living Arrangements: Other relatives(with sister) Available Help at Discharge: Family, Available PRN/intermittently Type of Home: House Home Access: Stairs to enter Technical brewer of Steps: 3 Entrance Stairs-Rails: Left Home Layout: One level Bathroom Shower/Tub: Chiropodist: [place]: None Additional Comments: questionable historian at this time  Functional History: Prior Function Level of Independence: Independent Functional Status:  Mobility: Bed Mobility Overal bed mobility: Needs Assistance Bed Mobility: Supine to Sit Supine to sit: Min assist General bed mobility comments: pt up in bathroom staring at wall upon my arrival Transfers Overall transfer level: Needs assistance Equipment used: None Transfers: Sit to/from Stand Sit to Stand: Min guard General transfer comment: min assist for stability upon coming to upright, posterior list noted initially Ambulation/Gait Ambulation/Gait assistance: Min assist Gait Distance (Feet): 100 Feet Assistive device: 1 person hand held assist Gait Pattern/deviations: Step-through pattern, Decreased stride length, Decreased step length - left, Drifts right/left General Gait Details: patient with noted instability, and poor awareness of environment. Patient with increased left LE lag at times causing patient to lose balance requiring increased physical assist. Hands on min assist at all times for  basic  level ambulation Gait velocity: decreased  ADL: ADL Overall ADL's : Needs assistance/impaired Eating/Feeding Details (indicate cue type and reason): declines food and states "not hungry" Grooming: Wash/dry hands, Standing, Minimal assistance Grooming Details (indicate cue type and reason): pt requires steady (A) at sink level and (A) with sequencing task Lower Body Dressing: Min guard, Sitting/lateral leans Lower Body Dressing Details (indicate cue type and reason): Pt with wet socks due to urinating on them. I doffed them for him since they were wet. Handed pt a sock and he donned on right foot (taking over a minute to only get it half way on while bending forward so I cued him to cross his legs and he then got it the rest of the way on no problem), handed pt other sock and he immediiately tried to don on same right foot-VC to don on left foot, pt kept trying to put on right foot. Had to take his hands with sock in it and move it to his left foot then he donned it in same manner as right (needing VCs to cross leg to finish) Toilet Transfer: Min guard, Ambulation Functional mobility during ADLs: Minimal assistance(hand held (A)) General ADL Comments: pt completed bed to bathroom to sink level grooming  Cognition: Cognition Overall Cognitive Status: Impaired/Different from baseline Arousal/Alertness: Awake/alert Orientation Level: Oriented to person, Oriented to place, Disoriented to time, Oriented to situation Attention: Focused, Sustained Focused Attention: Appears intact Sustained Attention: Impaired Sustained Attention Impairment: Verbal basic, Functional basic Memory: Impaired Memory Impairment: Decreased short term memory Decreased Short Term Memory: Verbal basic, Functional basic Awareness: Impaired Awareness Impairment: Emergent impairment Problem Solving: Impaired Problem Solving Impairment: Verbal basic, Functional basic Executive Function: Initiating Initiating:  Impaired Initiating Impairment: Verbal basic Cognition Arousal/Alertness: Awake/alert Behavior During Therapy: Flat affect Overall Cognitive Status: Impaired/Different from baseline Area of Impairment: Orientation, Attention, Following commands, Safety/judgement, Problem solving Orientation Level: (initally could not tell me where he was, but at end of session he was able to) Current Attention Level: Sustained Memory: Decreased short-term memory Following Commands: Follows one step commands inconsistently Safety/Judgement: Decreased awareness of safety, Decreased awareness of deficits Awareness: Intellectual Problem Solving: Decreased initiation, Difficulty sequencing, Requires verbal cues, Requires tactile cues General Comments: Found pt up in bathroom by himself with 3 leads disconnected from telemetry box still hooked up on wall unit, bottom of gown he was wearing rolled up in his hand and linen bag in his hand with urine all over floor from recliner into bathroom staring at wall. Chair alarm not on (RN reports pt has been setting alarm off alot and he is stedy on his feet so they have been letting him get up)  Blood pressure (!) 178/111, pulse 65, temperature 97.8 F (36.6 C), temperature source Oral, resp. rate 18, height 6' (1.829 m), weight 81.8 kg, SpO2 95 %. Physical Exam  Vitals reviewed. Constitutional: No distress.  HENT:  Head: Normocephalic.  Normocephalic.  Eyes: Pupils are equal, round, and reactive to light. Conjunctivae are normal.  Neck: Normal range of motion.  Cardiovascular: Normal rate.  Respiratory: Effort normal.  Musculoskeletal: Normal range of motion.  Neurological:  Patient is alert with limited attention.  He can provide his date of birth but not place.  Needed multiple cues to answer biographical information and limited medical historian. Word finding deficits. LUE 3+ to 4-/5 prox to distal. LLE: 4/5 prox to distal. Senses pain and LT in all 4's.   Skin:  He is not diaphoretic.  LabResultsLast24Hours  No results found for this or any previous visit (from the past 24 hour(s)).    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 Additional subcentimeter nodules or intraparotid lymph nodes bilaterally. ENT referral is recommended. Electronically Signed   By: Logan Bores M.D.   On: 09/03/2017 12:13   Result Date: 09/03/2017 CLINICAL DATA:  Intracranial hemorrhage. EXAM: CT ANGIOGRAPHY HEAD AND NECK TECHNIQUE: Multidetector CT imaging of the head and neck was performed using the standard protocol during bolus administration of intravenous contrast. Multiplanar CT image reconstructions and MIPs were obtained to evaluate the vascular anatomy. Carotid stenosis measurements (when applicable) are obtained utilizing NASCET criteria, using the distal internal carotid diameter as the denominator. CONTRAST:  17mL ISOVUE-370 IOPAMIDOL (ISOVUE-370) INJECTION 76% COMPARISON:  Noncontrast head CT 09/03/2017. No prior angiographic imaging. FINDINGS: CTA NECK FINDINGS Aortic arch: Standard 3 vessel aortic arch with mild atherosclerotic plaque. Widely patent arch vessel origins. Right carotid system: Patent with scattered soft plaque most notable in the proximal ICA. No evidence of significant stenosis or dissection. Left carotid system: Patent with scattered soft and minimally calcified plaque most notable at the carotid bifurcation. No evidence of stenosis or dissection. Mildly ectatic appearance of the distal cervical ICA. Vertebral arteries: Patent without evidence of stenosis or dissection. Mildly dominant right vertebral artery. Skeleton: Chronic left maxillary sinusitis with a completely opacified and mildly expanded sinus which may reflect an underlying mucocele. Partial left anterior ethmoid air cell  opacification. Small right mastoid effusion. Advanced cervical disc degeneration and facet arthrosis. Other neck: 2.3 x 1.9 cm mildly heterogeneously enhancing mass in the superficial left parotid gland. Additional subcentimeter soft tissue nodules or lymph nodes in both parotid glands. Upper chest: Mild centrilobular emphysema. Review of the MIP images confirms the above findings CTA HEAD FINDINGS Anterior circulation: The internal carotid arteries are patent from skull base to carotid termini with diffuse atherosclerotic luminal irregularity but no evidence of flow limiting stenosis. ACAs and MCAs are patent with widespread irregularity. There are mild stenoses of the right ACA and right MCA origins with additional mild M1 and moderate M2 stenoses bilaterally. No aneurysm is identified. Posterior circulation: The intracranial vertebral arteries are patent to the basilar with moderate atherosclerotic irregularity but no significant stenosis. There is prominent diffuse irregularity of the basilar artery without significant stenosis. Patent posterior communicating arteries are present bilaterally. The PCAs are irregular diffusely with severe proximal right P2 and moderate proximal left P2 stenoses as well as additional severe branch vessel stenoses more distally bilaterally. Allowing for widespread posterior circulation atherosclerotic vessel irregularity and ectasias, no sizable saccular aneurysm or vascular malformation is identified. Venous sinuses: Patent. Anatomic variants: None. Delayed phase: Similar appearance of right thalamic hemorrhage measuring approximately 2.6 x 1.7 cm and of small volume blood throughout the ventricles compared to today's earlier CT. No abnormal enhancement identified. Review of the MIP images confirms the above findings IMPRESSION: 1. Widespread intracranial atherosclerosis without large vessel occlusion. 2. 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 3. Mild right A1 and  bilateral M1 stenoses. 4. Mild cervical carotid artery atherosclerosis without stenosis. 5. 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 6. Aortic Atherosclerosis (ICD10-I70.0) and Emphysema (ICD10-J43.9). Electronically Signed: By: Logan Bores M.D. 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 Additional subcentimeter nodules or intraparotid lymph nodes bilaterally. ENT referral is recommended. Electronically Signed   By: Logan Bores M.D.   On: 09/03/2017 12:13   Result Date: 09/03/2017 CLINICAL DATA:  Intracranial hemorrhage. EXAM: CT ANGIOGRAPHY HEAD AND NECK TECHNIQUE: Multidetector CT imaging of the head and neck was performed using the standard protocol during bolus administration of intravenous contrast. Multiplanar CT image reconstructions and MIPs were obtained to evaluate the vascular anatomy. Carotid stenosis measurements (when applicable) are obtained utilizing NASCET criteria, using the distal internal carotid diameter as the denominator. CONTRAST:  61mL ISOVUE-370 IOPAMIDOL (ISOVUE-370) INJECTION 76% COMPARISON:  Noncontrast head CT 09/03/2017. No prior angiographic imaging. FINDINGS: CTA NECK FINDINGS Aortic arch: Standard 3 vessel aortic arch with mild atherosclerotic plaque. Widely patent arch vessel origins. Right carotid system: Patent with scattered soft plaque most notable in the proximal ICA. No evidence of significant stenosis or dissection. Left carotid system: Patent with scattered soft and minimally calcified plaque most notable at the carotid bifurcation. No evidence of stenosis or dissection. Mildly ectatic appearance of the distal cervical ICA. Vertebral arteries: Patent without evidence of stenosis or dissection. Mildly dominant right vertebral artery. Skeleton: Chronic left maxillary sinusitis with a  completely opacified and mildly expanded sinus which may reflect an underlying mucocele. Partial left anterior ethmoid air cell opacification. Small right mastoid effusion. Advanced cervical disc degeneration and facet arthrosis. Other neck: 2.3 x 1.9 cm mildly heterogeneously enhancing mass in the superficial left parotid gland. Additional subcentimeter soft tissue nodules or lymph nodes in both parotid glands. Upper chest: Mild centrilobular emphysema. Review of the MIP images confirms the above findings CTA HEAD FINDINGS Anterior circulation: The internal carotid arteries are patent from skull base to carotid termini with diffuse atherosclerotic luminal irregularity but no evidence of flow limiting stenosis. ACAs and MCAs are patent with widespread irregularity. There are mild stenoses of the right ACA and right MCA origins with additional mild M1 and moderate M2 stenoses bilaterally. No aneurysm is identified. Posterior circulation: The intracranial vertebral arteries are patent to the basilar with moderate atherosclerotic irregularity but no significant stenosis. There is prominent diffuse irregularity of the basilar artery without significant stenosis. Patent posterior communicating arteries are present bilaterally. The PCAs are irregular diffusely with severe proximal right P2 and moderate proximal left P2 stenoses as well as additional severe branch vessel stenoses more distally bilaterally. Allowing for widespread posterior circulation atherosclerotic vessel irregularity and ectasias, no sizable saccular aneurysm or vascular malformation is identified. Venous sinuses: Patent. Anatomic variants: None. Delayed phase: Similar appearance of right thalamic hemorrhage measuring approximately 2.6 x 1.7 cm and of small volume blood throughout the ventricles compared to today's earlier CT. No abnormal enhancement identified. Review of the MIP images confirms the above findings IMPRESSION: 1. Widespread intracranial  atherosclerosis without large vessel occlusion. 2. 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 3. Mild right A1 and bilateral M1 stenoses. 4. Mild cervical carotid artery atherosclerosis without stenosis. 5. 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 6. Aortic Atherosclerosis (ICD10-I70.0) and Emphysema (ICD10-J43.9). Electronically Signed: By: Logan Bores M.D. On: 09/03/2017 10:57      Assessment/Plan: Diagnosis: right thalamic hemorrhage with left hemiparesis 1. Does the need for close, 24 hr/day medical supervision in concert with the patient's rehab needs make it unreasonable for this patient to be served in a less intensive setting? Yes 2. Co-Morbidities requiring supervision/potential complications: HTN, post-IVH sequelae 3. Due to bladder management, bowel  management, safety, skin/wound care, disease management, medication administration, pain management and patient education, does the patient require 24 hr/day rehab nursing? Yes 4. Does the patient require coordinated care of a physician, rehab nurse, PT (1-2 hrs/day, 5 days/week), OT (1-2 hrs/day, 5 days/week) and SLP (1-2 hrs/day, 5 days/week) to address physical and functional deficits in the context of the above medical diagnosis(es)? Yes Addressing deficits in the following areas: balance, endurance, locomotion, strength, transferring, bowel/bladder control, bathing, dressing, feeding, grooming, toileting, cognition, speech, language and psychosocial support 5. Can the patient actively participate in an intensive therapy program of at least 3 hrs of therapy per day at least 5 days per week? Yes 6. The potential for patient to make measurable gains while on inpatient rehab is excellent 7. Anticipated functional outcomes upon discharge from inpatient rehab are modified independent  with PT, modified independent with OT, modified independent with SLP. 8. Estimated rehab length of stay to reach the above  functional goals is: 7 days 9. Anticipated D/C setting: Home 10. Anticipated post D/C treatments: [place] therapy 11. Overall Rehab/Functional Prognosis: excellent  RECOMMENDATIONS: This patient's condition is appropriate for continued rehabilitative care in the following setting: CIR Patient has agreed to participate in recommended program. Yes Note that insurance prior authorization may be required for reimbursement for recommended care.  Comment: Rehab Admissions Coordinator to follow up.  Thanks,  Meredith Staggers, MD, Mellody Drown  I have personally performed a face to face diagnostic evaluation of this patient. Additionally, I have reviewed and concur with the physician assistant's documentation above.    Lavon Paganini Angiulli, PA-C 09/05/2017        Revision History

## 2017-09-05 NOTE — H&P (Signed)
Physical Medicine and Rehabilitation Admission H&P       Chief Complaint  Patient presents with  . Code Stroke  : HPI: Per CT evidence of 62 year old right-handed male history of tobacco abuse.  Per chart review he lives with his sister.  One level home with 3 steps to entry.  Independent prior to admission.  Presented 09/02/2017 with left-sided weakness and altered mental status with aphasia.  Blood pressure 161 systolic per EMS.  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  No midline shift or hydrocephalus.  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  Patient did receive TPA.  CT angiogram of head and neck showed widespread intracranial atherosclerosis without large vessel occlusion.  Echocardiogram with ejection fraction of 09% grade 1 diastolic dysfunction.  Creatinine mildly elevated on admission 1.50 felt to be baseline and monitored.  Neurology follow-up advise conservative care close monitoring of blood pressure.  Tolerating a regular diet.  Physical and occupational therapy evaluations completed with recommendations of physical medicine rehab consult.  Patient was admitted for a comprehensive rehab program.  Review of Systems  Constitutional: Negative for weight loss.       Denies fever chills  HENT: Negative for ear pain.        Negative hearing loss  Eyes: Negative for photophobia.       Denies blurred vision  Respiratory: Negative for sputum production.   Cardiovascular: Negative for claudication.       No chest pain or shortness of breath  Gastrointestinal: Negative for vomiting.  Genitourinary: Negative for frequency.  Musculoskeletal: Negative for myalgias.  Skin: Negative for rash.  Neurological: Positive for dizziness and focal weakness.       Focal weakness with speech change.  Denies dizziness or headache  Psychiatric/Behavioral: Negative for depression.  All other systems reviewed and are negative.  History  reviewed. No pertinent past medical history. History reviewed. No pertinent surgical history.      Family History  Problem Relation Age of Onset  . Hypertension Mother    Social History:  reports that he has been smoking cigarettes. He does not have any smokeless tobacco history on file. His alcohol and drug histories are not on file. Allergies: Not on File       Medications Prior to Admission  Medication Sig Dispense Refill  . acetaminophen (TYLENOL) 500 MG tablet Take 500 mg by mouth every 6 (six) hours as needed for headache.    Marland Kitchen aspirin EC 81 MG tablet Take 81 mg by mouth daily.      Drug Regimen Review Drug regimen was reviewed and remains appropriate with no significant issues identified  Home: Home Living Family/patient expects to be discharged to:: Private residence Living Arrangements: Other relatives(with sister) Available Help at Discharge: Family, Available PRN/intermittently Type of Home: House Home Access: Stairs to enter Technical brewer of Steps: 3 Entrance Stairs-Rails: Left Home Layout: One level Bathroom Shower/Tub: Chiropodist: Standard Home Equipment: None Additional Comments: questionable historian at this time   Functional History: Prior Function Level of Independence: Independent  Functional Status:  Mobility: Bed Mobility Overal bed mobility: Needs Assistance Bed Mobility: Supine to Sit Supine to sit: Min assist General bed mobility comments: pt in recliner upon arrival  Transfers Overall transfer level: Needs assistance Equipment used: None Transfers: Sit to/from Stand Sit to Stand: Min guard General transfer comment: min guard for safety Ambulation/Gait Ambulation/Gait assistance: Min assist, Mod assist Gait Distance (Feet): 150 Feet Assistive device: (assist  for balance at trunk with use of gait belt) Gait Pattern/deviations: Step-through pattern, Decreased stride length, Decreased step length -  left, Drifts right/left, Decreased dorsiflexion - right, Decreased dorsiflexion - left, Wide base of support General Gait Details: pt with unsteady gait and with LOB requiring assistance to recover; pt with L LE lag and with difficulty with step through pattern; pt runs into objects on L side and with extremely slow gait speed Gait velocity: decreased Gait velocity interpretation: <1.31 ft/sec, indicative of household ambulator  ADL: ADL Overall ADL's : Needs assistance/impaired Eating/Feeding Details (indicate cue type and reason): declines food and states "not hungry" Grooming: Wash/dry hands, Standing, Minimal assistance Grooming Details (indicate cue type and reason): pt requires steady (A) at sink level and (A) with sequencing task Lower Body Dressing: Min guard, Sitting/lateral leans Lower Body Dressing Details (indicate cue type and reason): Pt with wet socks due to urinating on them. I doffed them for him since they were wet. Handed pt a sock and he donned on right foot (taking over a minute to only get it half way on while bending forward so I cued him to cross his legs and he then got it the rest of the way on no problem), handed pt other sock and he immediiately tried to don on same right foot-VC to don on left foot, pt kept trying to put on right foot. Had to take his hands with sock in it and move it to his left foot then he donned it in same manner as right (needing VCs to cross leg to finish) Toilet Transfer: Min guard, Ambulation Functional mobility during ADLs: Minimal assistance(hand held (A)) General ADL Comments: pt completed bed to bathroom to sink level grooming  Cognition: Cognition Overall Cognitive Status: Impaired/Different from baseline Arousal/Alertness: Awake/alert Orientation Level: Oriented to person, Oriented to place, Disoriented to time, Oriented to situation Attention: Focused, Sustained Focused Attention: Appears intact Sustained Attention:  Impaired Sustained Attention Impairment: Verbal basic, Functional basic Memory: Impaired Memory Impairment: Decreased short term memory Decreased Short Term Memory: Verbal basic, Functional basic Awareness: Impaired Awareness Impairment: Emergent impairment Problem Solving: Impaired Problem Solving Impairment: Verbal basic, Functional basic Executive Function: Initiating Initiating: Impaired Initiating Impairment: Verbal basic Cognition Arousal/Alertness: Awake/alert Behavior During Therapy: WFL for tasks assessed/performed Overall Cognitive Status: Impaired/Different from baseline Area of Impairment: Attention, Following commands, Safety/judgement, Problem solving, Memory Orientation Level: (initally could not tell me where he was, but at end of session he was able to) Current Attention Level: Sustained Memory: Decreased short-term memory Following Commands: Follows one step commands with increased time Safety/Judgement: Decreased awareness of safety, Decreased awareness of deficits Awareness: Intellectual Problem Solving: Decreased initiation, Difficulty sequencing, Requires verbal cues, Slow processing General Comments: pt continues to have difficulty problem solving and with L side inattention needing almost constant cues for awareness of L side; pt has little insight into deficits  Physical Exam: Blood pressure (!) 172/109, pulse 60, temperature 98.2 F (36.8 C), temperature source Oral, resp. rate 20, height 6' (1.829 m), weight 81.8 kg, SpO2 98 %. Physical Exam  Constitutional: No distress.  HENT:  Head: Normocephalic.     Eyes: Pupils are equal, round, and reactive to light. EOM are normal.     Neck: Normal range of motion. Neck supple. No JVD present. No tracheal deviation present. No thyromegaly present.     Cardiovascular: Normal rate and regular rhythm. Exam reveals no friction rub.  No murmur heard.    Respiratory: Effort normal. No respiratory distress. He  has  no wheezes. He has no rales.     GI:  Abdomen soft nontender  Neurological:  Patient is alert with limited attention.  Provides his name and age.  Needed multiple cues for biographical information. Word finding deficits.  Follows simple commands. LUE 3+ to 4/5 prox to distal. LLE 4/5 prox to distal. RUE and RLE 5/5. Sensory function within normal limits.   Skin: Skin is warm. He is not diaphoretic.  Psychiatric: He has a normal mood and affect. His behavior is normal.    LabResultsLast48Hours  Results for orders placed or performed during the hospital encounter of 09/02/17 (from the past 48 hour(s))  CBC     Status: Abnormal   Collection Time: 09/04/17  3:56 AM  Result Value Ref Range   WBC 5.8 4.0 - 10.5 K/uL   RBC 4.58 4.22 - 5.81 MIL/uL   Hemoglobin 8.6 (L) 13.0 - 17.0 g/dL    Comment: REPEATED TO VERIFY   HCT 31.3 (L) 39.0 - 52.0 %   MCV 68.3 (L) 78.0 - 100.0 fL   MCH 18.8 (L) 26.0 - 34.0 pg   MCHC 27.5 (L) 30.0 - 36.0 g/dL   RDW 19.0 (H) 11.5 - 15.5 %   Platelets 371 150 - 400 K/uL    Comment: Performed at [facility], [address] [place], [place] [address]  Basic metabolic panel     Status: Abnormal   Collection Time: 09/04/17  3:56 AM  Result Value Ref Range   Sodium 141 135 - 145 mmol/L   Potassium 3.7 3.5 - 5.1 mmol/L   Chloride 106 98 - 111 mmol/L   CO2 25 22 - 32 mmol/L   Glucose, Bld 89 70 - 99 mg/dL   BUN 12 8 - 23 mg/dL   Creatinine, Ser 1.44 (H) 0.61 - 1.24 mg/dL   Calcium 9.2 8.9 - 10.3 mg/dL   GFR calc non Af Amer 51 (L) >60 mL/min   GFR calc Af Amer 59 (L) >60 mL/min    Comment: (NOTE) The eGFR has been calculated using the CKD EPI equation. This calculation has not been validated in all clinical situations. eGFR's persistently <60 mL/min signify possible Chronic Kidney Disease.    Anion gap 10 5 - 15    Comment: Performed at [facility] [address] [place], [place] [address]  Lipid panel      Status: Abnormal   Collection Time: 09/04/17  3:56 AM  Result Value Ref Range   Cholesterol 182 0 - 200 mg/dL   Triglycerides 90 <150 mg/dL   HDL 33 (L) >40 mg/dL   Total CHOL/HDL Ratio 5.5 RATIO   VLDL 18 0 - 40 mg/dL   LDL Cholesterol 131 (H) 0 - 99 mg/dL    Comment:        Total Cholesterol/HDL:CHD Risk Coronary Heart Disease Risk Table                     Men   Women  1/2 Average Risk   3.4   3.3  Average Risk       5.0   4.4  2 X Average Risk   9.6   7.1  3 X Average Risk  23.4   11.0        Use the calculated Patient Ratio above and the CHD Risk Table to determine the patient's CHD Risk.        ATP III CLASSIFICATION (LDL):  <100  mg/dL   Optimal  100-129  mg/dL   Near or Above                    Optimal  130-159  mg/dL   Borderline  160-189  mg/dL   High  >190     mg/dL   Very High Performed at [facility] [address] [place], [place] [address]   Hemoglobin A1c     Status: None   Collection Time: 09/04/17  3:56 AM  Result Value Ref Range   Hgb A1c MFr Bld 5.4 4.8 - 5.6 %    Comment: (NOTE) Pre diabetes:          5.7%-6.4% Diabetes:              >6.4% Glycemic control for   <7.0% adults with diabetes    Mean Plasma Glucose 108.28 mg/dL    Comment: Performed at [facility] [address] [place], [place] [address]  TSH     Status: None   Collection Time: 09/04/17  3:56 AM  Result Value Ref Range   TSH 2.316 0.350 - 4.500 uIU/mL    Comment: Performed by a 3rd Generation assay with a functional sensitivity of <=0.01 uIU/mL. Performed at [facility], [place] [address], [place], [place] [address]   Vitamin B12     Status: None   Collection Time: 09/04/17  3:56 AM  Result Value Ref Range   Vitamin B-12 289 180 - 914 pg/mL    Comment: (NOTE) This assay is not validated for testing neonatal or myeloproliferative syndrome specimens for Vitamin B12 levels. Performed at [facility], [place] [address],  [place], [place] [address]   CBC     Status: Abnormal   Collection Time: 09/05/17  5:01 AM  Result Value Ref Range   WBC 5.5 4.0 - 10.5 K/uL   RBC 4.42 4.22 - 5.81 MIL/uL   Hemoglobin 8.4 (L) 13.0 - 17.0 g/dL   HCT 30.3 (L) 39.0 - 52.0 %   MCV 68.6 (L) 78.0 - 100.0 fL   MCH 19.0 (L) 26.0 - 34.0 pg   MCHC 27.7 (L) 30.0 - 36.0 g/dL   RDW 18.8 (H) 11.5 - 15.5 %   Platelets 297 150 - 400 K/uL    Comment: Performed at [facility], [place] [address], [place], [place] [address]  Basic metabolic panel     Status: Abnormal   Collection Time: 09/05/17  5:01 AM  Result Value Ref Range   Sodium 141 135 - 145 mmol/L   Potassium 3.6 3.5 - 5.1 mmol/L   Chloride 106 98 - 111 mmol/L   CO2 22 22 - 32 mmol/L   Glucose, Bld 92 70 - 99 mg/dL   BUN 16 8 - 23 mg/dL   Creatinine, Ser 1.47 (H) 0.61 - 1.24 mg/dL   Calcium 9.4 8.9 - 10.3 mg/dL   GFR calc non Af Amer 49 (L) >60 mL/min   GFR calc Af Amer 57 (L) >60 mL/min    Comment: (NOTE) The eGFR has been calculated using the CKD EPI equation. This calculation has not been validated in all clinical situations. eGFR's persistently <60 mL/min signify possible Chronic Kidney Disease.    Anion gap 13 5 - 15    Comment: Performed at [facility] [address] [place], [place] [address]     ImagingResults(Last48hours)  No results found.  Medical Problem List and Plan: 1.  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  DVT Prophylaxis/Anticoagulation: SCDs.  Monitor for any signs of DVT 3. Pain Management: Tylenol as needed 4. Mood: Provide emotional support 5. Neuropsych: This patient is capable of making decisions on his own behalf. 6. Skin/Wound Care: Routine skin checks 7. Fluids/Electrolytes/Nutrition: Routine in and outs with follow-up chemistries 8.  Hypertension.  Monitor with increased mobility. VS per routine 9.  Tobacco abuse.   Counseling 10.  CKD stage II.  Follow-up chemistries upon admit             -encourage fluids    Post Admission Physician Evaluation: 1. Functional deficits secondary  to Right thalamic hemorrhage. 2. Patient is admitted to receive collaborative, interdisciplinary care between the physiatrist, rehab nursing staff, and therapy team. 3. Patient's level of medical complexity and substantial therapy needs in context of that medical necessity cannot be provided at a lesser intensity of care such as a SNF. 4. Patient has experienced substantial functional loss from his/her baseline which was documented above under the "Functional History" and "Functional Status" headings.  Judging by the patient's diagnosis, physical exam, and functional history, the patient has potential for functional progress which will result in measurable gains while on inpatient rehab.  These gains will be of substantial and practical use upon discharge  in facilitating mobility and self-care at the household level. 5. Physiatrist will provide 24 hour management of medical needs as well as oversight of the therapy plan/treatment and provide guidance as appropriate regarding the interaction of the two. 6. The Preadmission Screening has been reviewed and patient status is unchanged unless otherwise stated above. 7. 24 hour rehab nursing will assist with bladder management, bowel management, safety, skin/wound care, disease management, medication administration, pain management and patient education  and help integrate therapy concepts, techniques,education, etc. 8. PT will assess and treat for/with: Lower extremity strength, range of motion, stamina, balance, functional mobility, safety, adaptive techniques and equipment, NMR, community reentry.   Goals are: mod I. 9. OT will assess and treat for/with: ADL's, functional mobility, safety, upper extremity strength, adaptive techniques and equipment, NMR, community reentry.   Goals are:  mod I. Therapy may proceed with showering this patient. 10. SLP will assess and treat for/with: cognition, communication.  Goals are: mod I. 11. Case Management and Social Worker will assess and treat for psychological issues and discharge planning. 12. Team conference will be held weekly to assess progress toward goals and to determine barriers to discharge. 13. Patient will receive at least 3 hours of therapy per day at least 5 days per week. 14. ELOS: 7 days       15. Prognosis:  excellent   I have personally performed a face to face diagnostic evaluation of this patient and formulated the key components of the plan.  Additionally, I have personally reviewed laboratory data, imaging studies, as well as relevant notes and concur with the physician assistant's documentation above.  Meredith Staggers, MD, FAAPMR     Lavon Paganini [place], PA-C 09/05/2017

## 2017-09-05 NOTE — Progress Notes (Addendum)
STROKE TEAM PROGRESS NOTE   SUBJECTIVE (INTERVAL HISTORY Patient in bed in no acute distress.  He is awake and alert to self, place and situation does not the name of the hospital but aware he is in the hospital and does not know the year.  Patient without evidence of aphasia or dysarthria.  He is aware his sister will come to the hospital later on today for discussion about placement.  OBJECTIVE Temp:  [97.7 F (36.5 C)-98.7 F (37.1 C)] 98.2 F (36.8 C) (08/09 1240) Pulse Rate:  [52-65] 62 (08/09 1240) Cardiac Rhythm: Normal sinus rhythm (08/09 0703) Resp:  [12-20] 16 (08/09 1240) BP: (146-178)/(96-130) 158/96 (08/09 1240) SpO2:  [95 %-100 %] 100 % (08/09 1240) Weight:  [81.8 kg] 81.8 kg (08/08 2131)  No results for input(s): GLUCAP in the last 168 hours. Recent Labs  Lab 09/02/17 2134 09/02/17 2146 09/04/17 0356 09/05/17 0501  NA 138 140 141 141  K 3.9 4.0 3.7 3.6  CL 106 104 106 106  CO2 23  --  25 22  GLUCOSE 100* 99 89 92  BUN 12 16 12 16   CREATININE 1.50* 1.50* 1.44* 1.47*  CALCIUM 9.3  --  9.2 9.4   Recent Labs  Lab 09/02/17 2134  AST 24  ALT 22  ALKPHOS 104  BILITOT 0.5  PROT 7.8  ALBUMIN 3.9   Recent Labs  Lab 09/02/17 2134 09/02/17 2146 09/04/17 0356 09/05/17 0501  WBC 6.6  --  5.8 5.5  NEUTROABS 4.2  --   --   --   HGB 9.4* 11.6* 8.6* 8.4*  HCT 34.1* 34.0* 31.3* 30.3*  MCV 69.3*  --  68.3* 68.6*  PLT 372  --  371 297   No results for input(s): CKTOTAL, CKMB, CKMBINDEX, TROPONINI in the last 168 hours. Recent Labs    09/02/17 2134  LABPROT 13.3  INR 1.02   Recent Labs    09/02/17 2135  COLORURINE STRAW*  LABSPEC 1.005  PHURINE 6.0  GLUCOSEU NEGATIVE  HGBUR MODERATE*  BILIRUBINUR NEGATIVE  KETONESUR NEGATIVE  PROTEINUR NEGATIVE  NITRITE NEGATIVE  LEUKOCYTESUR NEGATIVE       Component Value Date/Time   CHOL 182 09/04/2017 0356   TRIG 90 09/04/2017 0356   HDL 33 (L) 09/04/2017 0356   CHOLHDL 5.5 09/04/2017 0356   VLDL 18  09/04/2017 0356   LDLCALC 131 (H) 09/04/2017 0356   Lab Results  Component Value Date   HGBA1C 5.4 09/04/2017      Component Value Date/Time   LABOPIA NONE DETECTED 09/02/2017 2135   COCAINSCRNUR NONE DETECTED 09/02/2017 2135   LABBENZ NONE DETECTED 09/02/2017 2135   AMPHETMU NONE DETECTED 09/02/2017 2135   THCU NONE DETECTED 09/02/2017 2135   LABBARB NONE DETECTED 09/02/2017 2135    Recent Labs  Lab 09/02/17 2134  ETH <10    I have personally reviewed the radiological images below and agree with the radiology interpretations.  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 Additional subcentimeter nodules or intraparotid lymph nodes bilaterally. ENT referral is recommended.   09/03/2017 IMPRESSION: 1. Widespread intracranial atherosclerosis without large vessel occlusion. 2. 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 3. Mild right A1 and bilateral M1 stenoses. 4. Mild cervical carotid artery atherosclerosis without stenosis. 5. 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 6. Aortic Atherosclerosis (ICD10-I70.0) and Emphysema (ICD10-J43.9). Electronically Signed: By: Seymour Bars.D.  On: 09/03/2017 10:57   Ct Head Wo Contrast 09/03/2017 IMPRESSION: Unchanged appearance of intraparenchymal hematoma centered in the right thalamus with associated intraventricular extension.   Ct Head Code Stroke Wo Contrast 09/02/2017 IMPRESSION: 1. Acute RIGHT thalamus hematoma with intraventricular extension. No midline shift or hydrocephalus.  2. Age indeterminate LEFT thalamus lacunar infarct. Chronic appearing bilateral basal ganglia lacunar infarcts.  TTE Pending  PHYSICAL EXAM  Temp:  [97.7 F (36.5 C)-98.7 F (37.1 C)] 98.2 F (36.8 C) (08/09 1240) Pulse Rate:  [52-65] 62 (08/09 1240) Resp:  [12-20] 16 (08/09 1240) BP:  (146-178)/(96-130) 158/96 (08/09 1240) SpO2:  [95 %-100 %] 100 % (08/09 1240) Weight:  [81.8 kg] 81.8 kg (08/08 2131)  General - Well nourished, well developed, mild lethargy.  Ophthalmologic - fundi not visualized due to noncooperation.  Cardiovascular - Regular rate and rhythm.  Mental Status -  Level of arousal and orientation to place, self and person were intact, but not orientated to time. Language including expression, naming, repetition, comprehension was assessed and found intact, but paucity of speech.  Cranial Nerves II - XII - II - Visual field intact OU. III, IV, VI - Extraocular movements intact. V - Facial sensation intact bilaterally. VII - mild left facial droop and absence of left forehead wrinkles with eyebrow raising up and left absence of frown. VIII - Hearing & vestibular intact bilaterally. X - Palate elevates symmetrically. XI - Chin turning & shoulder shrug intact bilaterally. XII - Tongue protrusion intact.  Motor Strength - The patient's strength was normal in RUE and RLE as well as LLE, however, LUE 4/5 and mild pronator drift.  Bulk was normal and fasciculations were absent.   Motor Tone - Muscle tone was assessed at the neck and appendages and was normal.  Reflexes - The patient's reflexes were symmetrical in all extremities and he had no pathological reflexes.  Sensory - Light touch, temperature/pinprick were assessed and were symmetrical.    Coordination - The patient had normal movements in the hands with no ataxia or dysmetria.  Tremor was absent.  Gait and Station - deferred.   ASSESSMENT/PLAN Mr. Luis Wiley is a 62 y.o. male with no significant hx admitted for left sided weakness, confusion and vomiting x 2. No tPA given due to [place].    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   Ongoing aggressive stroke risk factor management  Therapy recommendations: CIR   Disposition:  Pending placement in patient rehab or SNF,   Elevated Cre, likely CKD stage II  Cre 1.50->1.44-->1.47  Continue IVF  Encourage po intake  Monitoring BMP  Hypertension Stable  BP goal < 160 for now  Hydralazine PRN  Off cardene  Hyperlipidemia  Home meds:  none   LDL 131, goal < 70  Consider statin on discharge  Tobacco abuse  Current smoker  Smoking cessation counseling provided  Pt is willing to quit  Other Stroke Risk Factors    Other Active Problems  Bradycardia   Hospital day # [place]  N.P Stroke Neurology 09/05/2017 3:13  PM    To contact Stroke Continuity provider, please refer to [email or web address] After hours, contact General Neurology

## 2017-09-05 NOTE — Discharge Summary (Signed)
Stroke Discharge Summary  Patient ID: Luis Wiley   MRN: [id number]      DOB: [birth year]  Date of Admission: 09/02/2017 Date of Discharge: 09/05/2017  Attending Physician:  Rosalin Hawking, MD, Stroke MD Consultant(s):   rehabilitation medicine Patient's PCP:  No primary care provider on file.  Discharge Diagnoses:  Right thalamic ICH with IVH  Active Problems:   HTN   CKD stage II   HLD   smoker  History reviewed. No pertinent past medical history. History reviewed. No pertinent surgical history.  Medications to be continued on Rehab   LABORATORY STUDIES CBC    Component Value Date/Time   WBC 5.5 09/05/2017 0501   RBC 4.42 09/05/2017 0501   HGB 8.4 (L) 09/05/2017 0501   HCT 30.3 (L) 09/05/2017 0501   PLT 297 09/05/2017 0501   MCV 68.6 (L) 09/05/2017 0501   MCH 19.0 (L) 09/05/2017 0501   MCHC 27.7 (L) 09/05/2017 0501   RDW 18.8 (H) 09/05/2017 0501   LYMPHSABS 1.7 09/02/2017 2134   MONOABS 0.5 09/02/2017 2134   EOSABS 0.1 09/02/2017 2134   BASOSABS 0.1 09/02/2017 2134   CMP    Component Value Date/Time   NA 141 09/05/2017 0501   K 3.6 09/05/2017 0501   CL 106 09/05/2017 0501   CO2 22 09/05/2017 0501   GLUCOSE 92 09/05/2017 0501   BUN 16 09/05/2017 0501   CREATININE 1.47 (H) 09/05/2017 0501   CALCIUM 9.4 09/05/2017 0501   PROT 7.8 09/02/2017 2134   ALBUMIN 3.9 09/02/2017 2134   AST 24 09/02/2017 2134   ALT 22 09/02/2017 2134   ALKPHOS 104 09/02/2017 2134   BILITOT 0.5 09/02/2017 2134   GFRNONAA 49 (L) 09/05/2017 0501   GFRAA 57 (L) 09/05/2017 0501   COAGS Lab Results  Component Value Date   INR 1.02 09/02/2017   Lipid Panel    Component Value Date/Time   CHOL 182 09/04/2017 0356   TRIG 90 09/04/2017 0356   HDL 33 (L) 09/04/2017 0356   CHOLHDL 5.5 09/04/2017 0356   VLDL 18 09/04/2017 0356   LDLCALC 131 (H) 09/04/2017 0356   HgbA1C  Lab Results  Component Value Date   HGBA1C 5.4 09/04/2017   Urinalysis    Component Value Date/Time    COLORURINE STRAW (A) 09/02/2017 2135   APPEARANCEUR CLEAR 09/02/2017 2135   LABSPEC 1.005 09/02/2017 2135   PHURINE 6.0 09/02/2017 2135   GLUCOSEU NEGATIVE 09/02/2017 2135   HGBUR MODERATE (A) 09/02/2017 2135   BILIRUBINUR NEGATIVE 09/02/2017 2135   KETONESUR NEGATIVE 09/02/2017 2135   PROTEINUR NEGATIVE 09/02/2017 2135   NITRITE NEGATIVE 09/02/2017 2135   LEUKOCYTESUR NEGATIVE 09/02/2017 2135   Urine Drug Screen     Component Value Date/Time   LABOPIA NONE DETECTED 09/02/2017 2135   COCAINSCRNUR NONE DETECTED 09/02/2017 2135   LABBENZ NONE DETECTED 09/02/2017 2135   AMPHETMU NONE DETECTED 09/02/2017 2135   THCU NONE DETECTED 09/02/2017 2135   LABBARB NONE DETECTED 09/02/2017 2135    Alcohol Level    Component Value Date/Time   ETH <10 09/02/2017 2134     SIGNIFICANT DIAGNOSTIC STUDIES Ct Angio Head/NeckW Or Wo Contrast Addendum Date: 09/03/2017   ADDENDUM REPORT: 09/03/2017 12:13 ADDENDUM: 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 Additional subcentimeter nodules or intraparotid lymph nodes bilaterally. ENT referral is recommended.   09/03/2017 IMPRESSION: 1. Widespread intracranial atherosclerosis without large vessel occlusion. 2. 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 3. Mild  right A1 and bilateral M1 stenoses. 4. Mild cervical carotid artery atherosclerosis without stenosis. 5. 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 6. Aortic Atherosclerosis (ICD10-I70.0) and Emphysema (ICD10-J43.9). Electronically Signed: By: Logan Bores M.D. On: 09/03/2017 10:57   Ct Head Wo Contrast 09/03/2017 IMPRESSION: Unchanged appearance of intraparenchymal hematoma centered in the right thalamus with associated intraventricular extension.   Ct Head Code Stroke Wo Contrast 09/02/2017 IMPRESSION: 1. Acute RIGHT thalamus hematoma with intraventricular extension. No midline shift or hydrocephalus.  2. Age indeterminate LEFT thalamus  lacunar infarct. Chronic appearing bilateral basal ganglia lacunar infarcts.  TTE Pending    HISTORY OF PRESENT ILLNESS Luis Wiley is an 62 y.o. male with no documented medical history as he does not seek medical attention presents to [name] ER as a stroke alert for sudden onset left-sided weakness and confusion that began around 6 PM.  Patient was in his usual state of health and around 6 PM he was noted to be confused, slurring his words.  EMS was called and patient complained of the headache and noted to have left-sided weakness.  His speech was slurred and had trouble answering questions and therefore stroke alerted.  Patient vomited twice en route to the hospital.  Blood pressure was 175 systolic per EMS.  Family states that last year he had episode where he had gait imbalance and left-sided weakness.  Since then he would have intermittent episodes of worsening left-sided weakness.  Date last known well:  Time last known well:  tPA Given:  NIHSS:  Baseline MRS  Intracerebral Hemorrhage (ICH) Score  Glascow Coma Score  13-15 0  Age >/= 80 no 0  ICH volume >/= 13ml  no 0  IVH yes +1  Infratentorial origin no 0 Total:  1   HOSPITAL COURSE Luis Wiley is a 62 y.o. male with no significant hx admitted for left sided weakness, confusion and vomiting x 2. No tPA given due to [place].    Right thalamic ICH with IVH - likely hypertensive   Resultant left facial droop and left UE weakness  CT showed right thalamic small ICH with IVH  Repeat CT hematoma stable and no hydrocephalus  CTA head and neck diffuse athero but no LVO or aneurysm or AVM  2D Echo EF 60-65%  LDL 131   HgbA1c 5.4  UDS negative  SCDs for VTE prophylaxis  No antithrombotic prior to admission, now on No antithrombotic.   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 rate 20, height 6' (1.829 m), weight 81.8 kg, SpO2 98 %.  General - Well nourished, well developed, mild lethargy.  Ophthalmologic - fundi not visualized  due to noncooperation.  Cardiovascular - Regular rate and rhythm.  Mental Status -  Level of arousal and orientation to place, self and person were intact, but not orientated to time. Language including expression, naming, repetition, comprehension was assessed and found intact, but paucity of speech, psychomotor slowing  Cranial Nerves II - XII - II - Visual field intact OU. III, IV, VI - Extraocular movements intact. V - Facial sensation intact bilaterally. VII - mild left facial droop. VIII - Hearing & vestibular intact bilaterally. X - Palate elevates symmetrically. XI - Chin turning & shoulder shrug intact bilaterally. XII - Tongue protrusion intact.  Motor Strength - The patient's strength was normal in RUE and RLE as well as LLE, however, LUE 4+/5 and mild pronator drift.  Bulk was normal and fasciculations were absent.   Motor Tone - Muscle tone was assessed at the neck and appendages and was normal.  Reflexes - The patient's reflexes were symmetrical in all extremities and he had no pathological reflexes.  Sensory - Light touch,  temperature/pinprick were assessed and were symmetrical.    Coordination - The patient had normal movements in the hands with no ataxia or dysmetria.  Tremor was absent.  Gait and Station - deferred.  Discharge Diet   Diet Order            Diet Heart Room service appropriate? Yes; Fluid consistency: Thin  Diet effective now             liquids  DISCHARGE PLAN  Disposition:  Transfer to [facility] for ongoing PT, OT and ST  No antithrombotic for secondary stroke prevention due to [place].  Recommend ongoing risk factor control by Primary Care Physician at time of discharge from inpatient rehabilitation.  Follow-up with PCP in 2 weeks following discharge from rehab.  Follow-up in [facility] in 4 weeks following discharge from rehab, office to schedule an appointment.   35 minutes were spent preparing discharge.  Rosalin Hawking, MD PhD Stroke Neurology 09/05/2017 5:29 PM

## 2017-09-05 NOTE — Progress Notes (Signed)
PMR Admission Coordinator Pre-Admission Assessment  Patient: Luis Wiley is an 62 y.o., male MRN: [id number] DOB: 05-27-1955 Height: 6' (182.9 cm) Weight: 81.8 kg                                                                                                                                                  Insurance Information HMO:     PPO:      PCP:      IPA:      80/20:      OTHER:  PRIMARY: Uninsured.      Policy#:       Subscriber:  CM Name:       Phone#:      Fax#:  Pre-Cert#:       Employer:  Benefits:  Phone #:      Name:  Eff. Date:      Deduct:       Out of Pocket Max:       Life Max:  CIR:       SNF:  Outpatient:      Co-Pay:  Home Health:       Co-Pay:  DME:      Co-Pay:  Providers:  SECONDARY:       Policy#:  Subscriber:  CM Name:      Phone#:      Fax#:  Pre-Cert#:       Employer:  Benefits:  Phone #:      Name:  Eff. Date:      Deduct:       Out of Pocket Max:       Life Max:  CIR:       SNF:  Outpatient:      Co-Pay:  Home Health:       Co-Pay:  DME:      Co-Pay:   Medicaid Application Date:       Case Manager:  Disability Application Date:       Case Worker:  *Family is currently working with Toniann Ket with financial counseling [phone]   Emergency Contact Information         Contact Information    Name Relation Home Work Mobile   Marlane Hatcher Sister   [phone]     Current Medical History  Patient Admitting Diagnosis: right thalamic hemorrhage with left hemiparesis History of Present Illness: Per CT evidence of 61 year old right-handed male history of tobacco abuse. Per chart review he lives with his sister. One level home with 3 steps to entry. Independent prior to admission. Presented 09/02/2017 with left-sided weakness and altered mental status with aphasia. Blood pressure 841 systolic per EMS. 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 No midline shift or hydrocephalus.  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 Patient did receive TPA. CT angiogram of head and  neck showed widespread intracranial atherosclerosis without large vessel occlusion. Echocardiogram with ejection fraction of 64% grade 1 diastolic dysfunction. Creatinine mildly elevated on admission 1.50 felt to be baseline and monitored. Neurology follow-up advise conservative care close monitoring of blood pressure. Tolerating a regular diet. Physical and occupational therapy evaluations completed with recommendations of physical medicine rehab consult. Patient is to be admitted for a comprehensive rehab program on 09/05/17. Complete NIHSS TOTAL: 4  Past Medical History  History reviewed. No pertinent past medical history.  Family History  family history includes Hypertension in his mother.  Prior Rehab/Hospitalizations:  Has the patient had major surgery during 100 days prior to admission? No  Current Medications   Current Facility-Administered Medications:  .  acetaminophen (TYLENOL) tablet 650 mg, 650 mg, Oral, Q4H PRN, 650 mg at 09/04/17 0951 **OR** [DISCONTINUED] acetaminophen (TYLENOL) solution 650 mg, 650 mg, Per Tube, Q4H PRN **OR** [DISCONTINUED] acetaminophen (TYLENOL) suppository 650 mg, 650 mg, Rectal, Q4H PRN, Aroor, Karena Addison R, MD .  atorvastatin (LIPITOR) tablet 20 mg, 20 mg, Oral, q1800, Rosalin Hawking, MD .  hydrALAZINE (APRESOLINE) injection 5-10 mg, 5-10 mg, Intravenous, Q4H PRN, Rosalin Hawking, MD, 5 mg at 09/05/17 1552 .  lisinopril (PRINIVIL,ZESTRIL) tablet 20 mg, 20 mg, Oral, Daily, Rosalin Hawking, MD .  pantoprazole (PROTONIX) EC tablet 40 mg, 40 mg, Oral, Daily, Rosalin Hawking, MD, 40 mg at 09/05/17 1013 .  senna-docusate (Senokot-S) tablet 1 tablet, 1 tablet, Oral, BID, Aroor, Lanice Schwab, MD, 1 tablet at 09/05/17 1013  Patients Current Diet:     Diet Order                  Diet Heart Room service appropriate? Yes; Fluid consistency: Thin  Diet effective  now               Precautions / Restrictions Precautions Precautions: Fall Restrictions Weight Bearing Restrictions: No   Has the patient had 2 or more falls or a fall with injury in the past year?No  Prior Activity Level Community (5-7x/wk): pt was active; would work in yard, fiddle with Consulting civil engineer / [place] Devices/Equipment: [place]: None  Prior Device Use: Indicate devices/aids used by the patient prior to current illness, exacerbation or injury? None of the above  Prior Functional Level Prior Function Level of Independence: Independent  Self Care: Did the patient need help bathing, dressing, using the toilet or eating?  Independent  Indoor Mobility: Did the patient need assistance with walking from room to room (with or without device)? Independent  Stairs: Did the patient need assistance with internal or external stairs (with or without device)? Independent  Functional Cognition: Did the patient need help planning regular tasks such as shopping or remembering to take medications? Independent  Current Functional Level Cognition  Arousal/Alertness: Awake/alert Overall Cognitive Status: Impaired/Different from baseline Current Attention Level: Sustained Orientation Level: Oriented to person, Oriented to place, Disoriented to time, Oriented to situation Following Commands: Follows one step commands with increased time Safety/Judgement: Decreased awareness of safety, Decreased awareness of deficits General Comments: pt continues to have difficulty problem solving and with L side inattention needing almost constant cues for awareness of L side; pt has little insight into deficits Attention: Focused, Sustained Focused Attention: Appears intact Sustained Attention: Impaired Sustained Attention Impairment: Verbal basic, Functional basic Memory: Impaired Memory Impairment: Decreased short term  memory Decreased Short Term Memory: Verbal basic, Functional basic Awareness: Impaired Awareness Impairment: Emergent impairment Problem Solving: Impaired Problem Solving Impairment:  Verbal basic, Functional basic Executive Function: Initiating Initiating: Impaired Initiating Impairment: Verbal basic    Extremity Assessment (includes Sensation/Coordination)  Upper Extremity Assessment: LUE deficits/detail LUE Deficits / Details: AROM FF 100 degrees, able to open soap bottle with incr time and using L UE as a stabilizer only LUE Coordination: decreased fine motor  Lower Extremity Assessment: Defer to PT evaluation LLE Deficits / Details: noted LLE modest weakness and coordination difficulty ? related to poor awareness LLE Coordination: decreased gross motor    ADLs  Overall ADL's : Needs assistance/impaired Eating/Feeding Details (indicate cue type and reason): declines food and states "not hungry" Grooming: Wash/dry hands, Standing, Minimal assistance Grooming Details (indicate cue type and reason): pt requires steady (A) at sink level and (A) with sequencing task Lower Body Dressing: Min guard, Sitting/lateral leans Lower Body Dressing Details (indicate cue type and reason): Pt with wet socks due to urinating on them. I doffed them for him since they were wet. Handed pt a sock and he donned on right foot (taking over a minute to only get it half way on while bending forward so I cued him to cross his legs and he then got it the rest of the way on no problem), handed pt other sock and he immediiately tried to don on same right foot-VC to don on left foot, pt kept trying to put on right foot. Had to take his hands with sock in it and move it to his left foot then he donned it in same manner as right (needing VCs to cross leg to finish) Toilet Transfer: Min guard, Ambulation Functional mobility during ADLs: Minimal assistance(hand held (A)) General ADL Comments: pt completed bed to  bathroom to sink level grooming    Mobility  Overal bed mobility: Needs Assistance Bed Mobility: Supine to Sit Supine to sit: Min assist General bed mobility comments: pt in recliner upon arrival     Transfers  Overall transfer level: Needs assistance Equipment used: None Transfers: Sit to/from Stand Sit to Stand: Min guard General transfer comment: min guard for safety    Ambulation / Gait / Stairs / Wheelchair Mobility  Ambulation/Gait Ambulation/Gait assistance: Min assist, Mod assist Gait Distance (Feet): 150 Feet Assistive device: (assist for balance at trunk with use of gait belt) Gait Pattern/deviations: Step-through pattern, Decreased stride length, Decreased step length - left, Drifts right/left, Decreased dorsiflexion - right, Decreased dorsiflexion - left, Wide base of support General Gait Details: pt with unsteady gait and with LOB requiring assistance to recover; pt with L LE lag and with difficulty with step through pattern; pt runs into objects on L side and with extremely slow gait speed Gait velocity: decreased Gait velocity interpretation: <1.31 ft/sec, indicative of household ambulator    Posture / Balance Balance Overall balance assessment: Needs assistance Sitting balance-Leahy Scale: Good Standing balance-Leahy Scale: Poor Standing balance comment: No balance issues noted with coming back into room from bathroom High level balance activites: Direction changes, Turns, Head turns High Level Balance Comments: patient with poor awareness of environment during attempts at higher level tasks. Continuously running in to objects on left with left drift noted    Special needs/care consideration BiPAP/CPAP: no  CPM: no Continuous Drip IV: no Dialysis: no        Days: no Life Vest: no Oxygen: no  Special Bed: no Trach Size: no Wound Vac (area): no      Location: no Skin: No areas of concern  Location Bowel mgmt: No BM  record Bladder mgmt: typically continent per family Diabetic mgmt: NA     Previous Home Environment Living Arrangements: Other relatives(with sister) Available Help at Discharge: Family, Available PRN/intermittently Type of Home: House Home Layout: One level Home Access: Stairs to enter Entrance Stairs-Rails: Left Entrance Stairs-Number of Steps: 3 Bathroom Shower/Tub: Chiropodist: Standard Home Care Services: No Additional Comments: questionable historian at this time  Discharge Living Setting Plans for Discharge Living Setting: Patient's home, House, Lives with (comment)(lives with sister) Type of Home at Discharge: House Discharge Home Layout: One level Discharge Home Access: Stairs to enter Entrance Stairs-Rails: Left Entrance Stairs-Number of Steps: 3 steps Discharge Bathroom Shower/Tub: Tub/shower unit Discharge Bathroom Toilet: Standard Discharge Bathroom Accessibility: Yes How Accessible: Accessible via walker Does the patient have any problems obtaining your medications?: No  Social/Family/Support Systems Patient Roles: Other (Comment)(lives with sister) Contact Information: sister is emergency contact Anticipated Caregiver: Sister: [name] Anticipated Caregiver's Contact Information: [name]: [phone] Ability/Limitations of Caregiver: Caregiver works during the day; goals are for [employer] I Caregiver Availability: Evenings only Discharge Plan Discussed with Primary Caregiver: Yes Is Caregiver In Agreement with Plan?: Yes Does Caregiver/Family have Issues with Lodging/Transportation while Pt is in Rehab?: No   Goals/Additional Needs Patient/Family Goal for Rehab: PT/OT/SLP: Mod I  Expected length of stay: 7 days Cultural Considerations: NA Dietary Needs: heart health, thin liquids Equipment Needs: TBD Special Service Needs: NA Pt/Family Agrees to Admission and willing to participate: Yes(pt and sister) Program Orientation Provided &  Reviewed with Pt/Caregiver Including Roles  & Responsibilities: Yes(pt and sister)  Barriers to Discharge: Home environment access/layout, Lack of/limited family support  Barriers to Discharge Comments: 3 steps, sister works during the day   Decrease burden of Care through IP rehab admission: NA   Possible need for SNF placement upon discharge:Not anticipated   Patient Condition: This patient's condition remains as documented in the consult dated 09/05/17, in which the Rehabilitation Physician determined and documented that the patient's condition is appropriate for intensive rehabilitative care in an inpatient rehabilitation facility. Will admit to inpatient rehab today.  Preadmission Screen Completed By:  Jhonnie Garner, 09/05/2017 5:29 PM ______________________________________________________________________   Discussed status with Dr. Naaman Plummer on 09/05/17 at 5:37PM and received telephone approval for admission today.  Admission Coordinator:  Jhonnie Garner, time 5:37PM/Date 09/05/17           Cosigned by: Meredith Staggers, MD at 09/05/2017 5:43 PM  Revision History

## 2017-09-05 NOTE — Progress Notes (Signed)
Pt admitted to unit at 1805. Rn educated pt on safety plan and rehab schedule with verbal understanding. Pt in bed resting, denies pain at this time, bed alarm in place, call bell with in reach.

## 2017-09-06 ENCOUNTER — Inpatient Hospital Stay (HOSPITAL_COMMUNITY): Payer: Medicaid Other | Admitting: Speech Pathology

## 2017-09-06 ENCOUNTER — Inpatient Hospital Stay (HOSPITAL_COMMUNITY): Payer: Self-pay | Admitting: Occupational Therapy

## 2017-09-06 ENCOUNTER — Inpatient Hospital Stay (HOSPITAL_COMMUNITY): Payer: Medicaid Other | Admitting: Physical Therapy

## 2017-09-06 DIAGNOSIS — Z720 Tobacco use: Secondary | ICD-10-CM

## 2017-09-06 DIAGNOSIS — N183 Chronic kidney disease, stage 3 unspecified: Secondary | ICD-10-CM

## 2017-09-06 DIAGNOSIS — I10 Essential (primary) hypertension: Secondary | ICD-10-CM

## 2017-09-06 DIAGNOSIS — D649 Anemia, unspecified: Secondary | ICD-10-CM

## 2017-09-06 DIAGNOSIS — I610 Nontraumatic intracerebral hemorrhage in hemisphere, subcortical: Secondary | ICD-10-CM

## 2017-09-06 MED ORDER — ENSURE ENLIVE PO LIQD
237.0000 mL | Freq: Two times a day (BID) | ORAL | Status: DC
  Administered 2017-09-06 – 2017-09-12 (×3): 237 mL via ORAL

## 2017-09-06 MED ORDER — LISINOPRIL 40 MG PO TABS
40.0000 mg | ORAL_TABLET | Freq: Every day | ORAL | Status: DC
  Administered 2017-09-07 – 2017-09-12 (×6): 40 mg via ORAL
  Filled 2017-09-06 (×6): qty 1

## 2017-09-06 NOTE — Significant Event (Signed)
Pt not compliant with safety precautions. Pt continues to get out of bed unassisted. Reoriented to call bell and safety plan. Education provided about importance of compliance with safety precautions. Pt continues to sit on side on bed, lying toward to foot of bed. Staff attempted to reposition, pt refused. Continue plan of care.  Bailie Christenbury W Brooklyne Radke

## 2017-09-06 NOTE — Evaluation (Signed)
Speech Language Pathology Assessment and Plan  Patient Details  Name: Luis Wiley MRN: [id number] Date of Birth: [birth year]  SLP Diagnosis: Cognitive Impairments  Rehab Potential: Good ELOS: 7-10 days     Today's Date: 09/06/2017 SLP Individual Time: 6283-1517 SLP Individual Time Calculation (min): 55 min   Problem List:  Patient Active Problem List   Diagnosis Date Noted  . Tobacco abuse 09/06/2017  . CKD (chronic kidney disease) stage 3, GFR 30-59 ml/min (HCC) 09/06/2017  . Anemia 09/06/2017  . 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 Essential hypertension   . ICH (intracerebral hemorrhage) ([place]) 09/02/2017   Past Medical History: History reviewed. No pertinent past medical history. Past Surgical History: History reviewed. No pertinent surgical history.  Assessment / Plan / Recommendation Clinical Impression Patient is a 62 year old right-handed male history of tobacco abuse. Per chart review he lives with his sister. One level home with 3 steps to entry. Independent prior to admission. Presented 09/02/2017 with left-sided weakness and altered mental status with aphasia. Blood pressure 616 systolic per EMS. 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 Patient did receive TPA. CT angiogram of head and neck showed widespread intracranial atherosclerosis without large vessel occlusion. Echocardiogram with ejection fraction of 07% grade 1 diastolic dysfunction. Creatinine mildly elevated on admission 1.50 felt to be baseline and monitored. Neurology follow-up advise conservative care close monitoring of blood pressure. Tolerating a regular diet. Physical and occupational therapy evaluations completed with recommendations of physical medicine rehab consult. Patient was admitted for a comprehensive rehab program 09/05/17.   Patient demonstrates severe  cognitive impairments impacting sustained attention, initiation, attention to left field of environment, functional problem solving, recall and emergent awareness which impacts his safety with functional and familiar tasks. Patient required more than a reasonable amount of time to respond to questions, suspect due to cognitive impairments since his language appeared [facility] for all tasks assessed, however, continued diagnostic treatment is required. Patient would benefit from skilled SLP intervention to maximize his cognitive function and overall functional independence prior to discharge home.   Skilled Therapeutic Interventions          Administered a cognitive-linguistic evaluation, please see above for details. Educated patient in regards to his current cognitive-linguistic function and goals of skilled SLP intervention. Patient verbalized understanding and agreement.   SLP Assessment  Patient will need skilled [facility] during CIR admission    Recommendations  Oral Care Recommendations: Oral care BID Recommendations for Other Services: Neuropsych consult Patient destination: Home Follow up Recommendations: Home Health SLP;24 hour supervision/assistance;Outpatient SLP Equipment Recommended: None recommended by SLP    SLP Frequency 3 to 5 out of 7 days   SLP Duration  SLP Intensity  SLP Treatment/Interventions 7-10 days   Minumum of 1-2 x/day, 30 to 90 minutes  Cognitive remediation/compensation;Internal/external aids;Therapeutic Activities;Patient/family education;Functional tasks;Cueing hierarchy    Pain Pain Assessment Faces Pain Scale: No hurt  Prior Functioning Type of Home: House  Lives With: (sister Luis Wiley)) Available Help at Discharge: Family;Available PRN/intermittently(sister Luis Wiley) works during the day)  Function:  Cognition Comprehension Comprehension assist level: Understands basic 50 - 74% of the time/ requires cueing 25 - 49% of the time   Expression   Expression assist level: Expresses basic 50 - 74% of the time/requires cueing 25 - 49% of the time. Needs to repeat parts of sentences.  Social Interaction Social Interaction assist level: Interacts appropriately 25 - 49% of time -  Needs frequent redirection.  Problem Solving Problem solving assist level: Solves basic 25 - 49% of the time - needs direction more than half the time to initiate, plan or complete simple activities  Memory Memory assist level: Recognizes or recalls 25 - 49% of the time/requires cueing 50 - 75% of the time   Short Term Goals: Week 1: SLP Short Term Goal 1 (Week 1): STGs=LTGs due to short length of stay   Refer to Care Plan for Long Term Goals  Recommendations for other services: Neuropsych  Discharge Criteria: Patient will be discharged from SLP if patient refuses treatment 3 consecutive times without medical reason, if treatment goals not met, if there is a change in medical status, if patient makes no progress towards goals or if patient is discharged from hospital.  The above assessment, treatment plan, treatment alternatives and goals were discussed and mutually agreed upon: by patient  Luis Wiley 09/06/2017, 3:08 PM

## 2017-09-06 NOTE — Evaluation (Signed)
Occupational Therapy Assessment and Plan  Patient Details  Name: Luis Wiley MRN: [id number] Date of Birth: [birth year]  OT Diagnosis: apraxia, cognitive deficits, hemiplegia affecting non-dominant side and muscle weakness (generalized) Rehab Potential: Rehab Potential (ACUTE ONLY): Good ELOS: 7-10 days   Today's Date: 09/06/2017 OT Individual Time: 1405-1540 OT Individual Time Calculation (min): 95 min     Problem List:  Patient Active Problem List   Diagnosis Date Noted  . Tobacco abuse 09/06/2017  . CKD (chronic kidney disease) stage 3, GFR 30-59 ml/min (HCC) 09/06/2017  . Anemia 09/06/2017  . 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 Essential hypertension   . ICH (intracerebral hemorrhage) ([place]) 09/02/2017    Past Medical History: History reviewed. No pertinent past medical history. Past Surgical History: History reviewed. No pertinent surgical history.  Assessment & Plan Clinical Impression: Per CT evidence of 62 year old right-handed male history of tobacco abuse. Per chart review he lives with his sister. One level home with 3 steps to entry. Independent prior to admission. Presented 09/02/2017 with left-sided weakness and altered mental status with aphasia. Blood pressure 916 systolic per EMS. 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 Patient did receive TPA. CT angiogram of head and neck showed widespread intracranial atherosclerosis without large vessel occlusion. Echocardiogram with ejection fraction of 38% grade 1 diastolic dysfunction. Creatinine mildly elevated on admission 1.50 felt to be baseline and monitored. Neurology follow-up advise conservative care close monitoring of blood pressure. Tolerating a regular diet. Physical and occupational therapy evaluations completed with recommendations of physical medicine rehab consult.  Patient was admitted for a comprehensive rehab program.  Patient currently requires min with basic self-care skills secondary to muscle weakness and muscle paralysis, decreased cardiorespiratoy endurance, impaired timing and sequencing, unbalanced muscle activation, motor apraxia, decreased coordination and decreased motor planning, decreased motor planning and ideational apraxia, decreased initiation, decreased attention, decreased awareness, decreased problem solving, decreased safety awareness, decreased memory and delayed processing and decreased standing balance, decreased postural control, hemiplegia and decreased balance strategies.  Prior to hospitalization, patient could complete BADLs with independent .  Patient will benefit from skilled intervention to increase independence with basic self-care skills prior to discharge home with care partner.  Anticipate patient will require 24 hour supervision and follow up home health.  OT - End of Session Endurance Deficit: Yes Endurance Deficit Description: 2/2 generalized deconditioning OT Assessment Rehab Potential (ACUTE ONLY): Good OT Barriers to Discharge: Decreased caregiver support;Lack of/limited family support;Incontinence OT Patient demonstrates impairments in the following area(s): Balance;Cognition;Endurance;Motor;Perception;Safety;Skin Integrity(skin integrity due to incontinence) OT Basic ADL's Functional Problem(s): Grooming;Bathing;Dressing;Toileting OT Advanced ADL's Functional Problem(s): Simple Meal Preparation OT Transfers Functional Problem(s): Toilet;Tub/Shower OT Additional Impairment(s): None OT Plan OT Intensity: Minimum of 1-2 x/day, 45 to 90 minutes OT Frequency: 5 out of 7 days OT Duration/Estimated Length of Stay: 7-10 days OT Treatment/Interventions: Balance/vestibular training;DME/adaptive equipment instruction;Patient/family education;Therapeutic Activities;Wheelchair propulsion/positioning;Cognitive  remediation/compensation;Psychosocial support;Therapeutic Exercise;Community reintegration;Functional mobility training;Self Care/advanced ADL retraining;UE/LE Strength taining/ROM;Discharge planning;Neuromuscular re-education;Skin care/wound managment;UE/LE Coordination activities;Visual/perceptual remediation/compensation;Pain management;Disease mangement/prevention OT Self Feeding Anticipated Outcome(s): No goal OT Basic Self-Care Anticipated Outcome(s): Supervision/cuing OT Toileting Anticipated Outcome(s): Supervision/cuing OT Bathroom Transfers Anticipated Outcome(s): Supervision/cuing  OT Recommendation Recommendations for Other Services: (N/A) Patient destination: Home Follow Up Recommendations: Home health OT Equipment Recommended: To be determined   Skilled Therapeutic Intervention Skilled OT session completed with focus on initial evaluation, education on OT role/POC, and establishment of patient centered goals. Pt completed bathing and  dressing at shower level, as well as grooming/oral care tasks and toilet transfer. Steady assist ambulatory transfers without device with cues for Lt foot clerance. Throughout session he required multimodal cues for initiation and termination of functional tasks. Pt with perseverative and particular behaviors. He is also easily distracted both internally/externally. At one point after he set up his toothbrush for oral care, he ambulated into bathroom, because his "mind" said the sink was there. He says his "thoughts race while (his) body is in slow motion." All tasks required significantly increased time due to slowness of movement and delayed processing. At end of session pt was left EOB with RN.   He reported HA was minimal at start of tx and that it was gone at end of tx.    OT Evaluation Precautions/Restrictions  Precautions Precautions: Fall Restrictions Weight Bearing Restrictions: No General Chart Reviewed: Yes Family/Caregiver Present: No   Pain: Headache: "It's a little there." He declined for OT to ask RN for medicinal intervention  Home Living/Prior [place] expects to be discharged to:: Private residence Living Arrangements: Other relatives(sister and brother in law) Available Help at Discharge: Family, Available PRN/intermittently(sister [name]) Type of Home: House Home Access: Stairs to enter [employer] of Steps: 3 Entrance Stairs-Rails: Left Home Layout: One level Bathroom Shower/Tub: Chiropodist: Standard Additional Comments: questionable historian at this time  Lives With: (sister [name])) IADL History Occupation: Retired Type of Occupation: Engineer, manufacturing  Leisure and Hobbies: Per pt, he plays multiple instruments: keyboard, violin, guitar  Prior Function Level of Independence: Independent with basic ADLs, Independent with gait, Independent with transfers(per pt report) ADL ADL ADL Comments: Please see functional navigator for ADL status Vision Baseline Vision/History: Wears glasses Wears Glasses: At all times Patient Visual Report: No change from baseline Vision Assessment?: Yes;No apparent visual deficits(Able to read ADL lables and wall signs in room) Perception  Perception: Within Functional Limits Praxis Praxis: Impaired Praxis Impairment Details: Initiation;Motor planning;Perseveration;Ideation;Ideomotor Cognition Overall Cognitive Status: Impaired/Different from baseline Arousal/Alertness: Awake/alert Orientation Level: Person;Situation Year: Other (Comment)(Unable to answer when given option of 2) Month: (Unable to answer when given option of 2) Day of Week: Other (Comment)(Unable to answer when given option of 2) Memory: Impaired Memory Impairment: Decreased recall of new information;Decreased short term memory Attention: Divided;Alternating;Sustained Focused Attention: Appears intact Sustained Attention: Impaired Divided  Attention: Impaired Awareness: Impaired Awareness Impairment: Emergent impairment Problem Solving: Impaired Initiating: Impaired Safety/Judgment: Impaired Sensation Sensation Light Touch: Appears Intact Hot/Cold: Impaired Detail(Hypersensitive to water temperature ) Proprioception: Impaired Detail(Pt bumping L UE on furniture during functional activity) Proprioception Impaired Details: Impaired LUE Stereognosis: Not tested Coordination Gross Motor Movements are Fluid and Coordinated: No Fine Motor Movements are Fluid and Coordinated: Yes Coordination and Movement Description: [place] intact for fastening pants, tying shoelaces, and completing oral care Motor  Motor Motor: Hemiplegia Motor - Skilled Clinical Observations: generalized deconditioning Mobility  Transfers Sit to Stand: Minimal Assistance - Patient > 75% Stand to Sit: Minimal Assistance - Patient > 75%  Trunk/Postural Assessment  Cervical Assessment Cervical Assessment: Exceptions to WFL(forward head) Thoracic Assessment Thoracic Assessment: Within Functional Limits Lumbar Assessment Lumbar Assessment: Within Functional Limits Postural Control Postural Control: Deficits on evaluation  Balance Balance Balance Assessed: Yes Dynamic Sitting Balance Dynamic Sitting - Level of Assistance: 5: Stand by assistance(Donning footwear EOB) Dynamic Standing Balance Dynamic Standing - Balance Support: No upper extremity supported Dynamic Standing - Level of Assistance: 4: Min assist Dynamic Standing - Comments: (Completing perihygiene when standing in shower)  Extremity/Trunk Assessment RUE Assessment RUE Assessment: Within Functional Limits LUE Assessment LUE Assessment: Within Functional Limits(WFL to meet demands of ADL tasks, given increased time )   See Function Navigator for Current Functional Status.   Refer to Care Plan for Long Term Goals  Recommendations for other services: None    Discharge Criteria:  Patient will be discharged from OT if patient refuses treatment 3 consecutive times without medical reason, if treatment goals not met, if there is a change in medical status, if patient makes no progress towards goals or if patient is discharged from hospital.  The above assessment, treatment plan, treatment alternatives and goals were discussed and mutually agreed upon: by patient  Skeet Simmer 09/06/2017, 4:15 PM

## 2017-09-06 NOTE — Progress Notes (Addendum)
Nutrition Brief Note  Patient identified on the Malnutrition Screening Tool (MST) Report  Wt Readings from Last 15 Encounters:  09/05/17 71.4 kg  09/04/17 81.8 kg   Body mass index is 21.35 kg/m. Patient meets criteria for healthy wt for ht based on current BMI.   RD attempted to see patient for MST of 2.0-noting he was unsure whether or not he had lost weight. Noted on MD H&P, pt noted as being "negative" for weight loss,   Patient with therapy, working in shower at time of RD arrival.   Per chart, patient ate 75% of his breakfast. He had not reported any decreased appetite on MST. Ordered prophylactic Ensure given his documented lower BMI.    If nutrition issues arise, please consult RD.   Burtis Junes RD, LDN, CNSC Clinical Nutrition Available Tues-Sat via Pager: [phone] 09/06/2017 2:36 PM

## 2017-09-06 NOTE — Evaluation (Addendum)
Physical Therapy Assessment and Plan  Patient Details  Name: Luis Wiley MRN: [id number] Date of Birth: [birth year]  PT Diagnosis: Abnormality of gait, Coordination disorder, Difficulty walking, Hemiplegia non-dominant, Impaired cognition and Muscle weakness (generalized, LUE>LLE) Rehab Potential: Excellent ELOS: 1 week   Today's Date: 09/06/2017 PT Individual Time: 1000-1056 PT Individual Time Calculation (min): 56 min    Problem List:  Patient Active Problem List   Diagnosis Date Noted  . Tobacco abuse 09/06/2017  . CKD (chronic kidney disease) stage 3, GFR 30-59 ml/min (HCC) 09/06/2017  . 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 Essential hypertension   . ICH (intracerebral hemorrhage) ([place]) 09/02/2017    Past Medical History: History reviewed. No pertinent past medical history. Past Surgical History: History reviewed. No pertinent surgical history.  Assessment & Plan Clinical Impression: Patient is a 62 year old right-handed male history of tobacco abuse. Per chart review he lives with his sister. One level home with 3 steps to entry. Independent prior to admission. Presented 09/02/2017 with left-sided weakness and altered mental status with aphasia. Blood pressure 412 systolic per EMS. 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 Patient did receive TPA. CT angiogram of head and neck showed widespread intracranial atherosclerosis without large vessel occlusion. Echocardiogram with ejection fraction of 87% grade 1 diastolic dysfunction. Creatinine mildly elevated on admission 1.50 felt to be baseline and monitored. Neurology follow-up advise conservative care close monitoring of blood pressure. Tolerating a regular diet. Physical and occupational therapy evaluations completed with recommendations of physical medicine rehab consult. Patient was  admitted for a comprehensive rehab program.  Patient transferred to CIR on 09/05/2017 .   Patient currently requires min with mobility secondary to muscle weakness, decreased cardiorespiratoy endurance, decreased coordination, decreased attention, decreased awareness, decreased problem solving, decreased safety awareness, decreased memory and delayed processing,  and decreased standing balance, decreased postural control, hemiplegia and decreased balance strategies.  Prior to hospitalization, patient was independent  with mobility and lived with (sister Luis Wiley)) in a House home.  Home access is 3Stairs to enter.  Patient will benefit from skilled PT intervention to maximize safe functional mobility, minimize fall risk and decrease caregiver burden for planned discharge home with intermittent assist.  Anticipate patient will HHPT vs OPPT at discharge.  PT - End of Session Activity Tolerance: Decreased this session Endurance Deficit: Yes Endurance Deficit Description: 2/2 generalized deconditioning PT Assessment Rehab Potential (ACUTE/IP ONLY): Excellent PT Barriers to Discharge: Decreased caregiver support;Lack of/limited family support PT Barriers to Discharge Comments: pt's sister Luis Wiley) works during the day PT Patient demonstrates impairments in the following area(s): Balance;Pain;Behavior;Perception;Endurance;Safety;Motor PT Transfers Functional Problem(s): Bed Mobility;Bed to Chair;Car;Furniture;Floor PT Locomotion Functional Problem(s): Ambulation;Stairs PT Plan PT Intensity: Minimum of 1-2 x/day ,45 to 90 minutes PT Frequency: 5 out of 7 days PT Duration Estimated Length of Stay: 1 week PT Treatment/Interventions: Ambulation/gait training;Cognitive remediation/compensation;Discharge planning;DME/adaptive equipment instruction;Functional mobility training;Pain management;Psychosocial support;Splinting/orthotics;Therapeutic Activities;UE/LE Strength taining/ROM;Visual/perceptual  remediation/compensation;UE/LE Coordination activities;Therapeutic Exercise;Stair training;Skin care/wound management;Patient/family education;Neuromuscular re-education;Functional electrical stimulation;Disease management/prevention;Community reintegration;Balance/vestibular training PT Transfers Anticipated Outcome(s): mod I with LRAD PT Locomotion Anticipated Outcome(s): supervision<>mod I with LRAD PT Recommendation Recommendations for Other Services: Speech consult;Neuropsych consult;Therapeutic Recreation consult Therapeutic Recreation Interventions: Stress management;Outing/community reintergration Follow Up Recommendations: (HHPT vs OPPT) Patient destination: Home Equipment Recommended: To be determined  Skilled Therapeutic Intervention Pt received in bed & agreeable to tx. Pt has c/o HA but unable to rate even when given scale - RN made aware  of pt's c/o and she administered meds. Therapist educated pt on daily therapy schedule, weekly interdisciplinary team meetings, safety precautions (to not get up unless assisted by staff member), and other various CIR information. Manual muscle testing and sensation testing completed, please see below for details. Pt's BP elevated, please see below, and RN contacted MD who cleared pt for participation in therapy but no parameters given. Pt completes stand pivot (bed>w/c and w/c<>elevated truck) with supervision overall, ambulates without AD with min assist x 10 ft and after activity pt reports his headache has dissipated. At end of session pt left sitting upright in bed with alarm set & all needs within reach.   Addendum: Pt reports his sister has a tall truck he will ride home in - may need to obtain seat height measurement from sister to allow simulation of truck transfer.   Provided pt with 16x18 w/c and cushion for improved positioning with OOB.  Notified RN of pt's position and lower diastolic BP at end of session.   PT  Evaluation Precautions/Restrictions Precautions Precautions: Fall Precaution Comments: monitor BP Restrictions Weight Bearing Restrictions: No  General Chart Reviewed: Yes Response to Previous Treatment: Not applicable Family/Caregiver Present: No   Vital Signs Therapy Vitals Pulse Rate: 59 BP: (!) 156/106 Patient Position (if appropriate): Sitting  Rechecked: HR = 60 bpm BP = 152/111 mmHg (RUE), sitting  Manual check by RN:  BP = 150/100 mmHg (LUE), sitting  Sitting up in bed at end of session: BP = 159/102 mmHg (LUE) HR = 60 bpm  Pain Pt reports c/o HA & RN administers meds. At end of session pt reports pain has "dissipated" and no longer has a HA.  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 Pt denies any changes in baseline vision. Vision to be assessed further in functional context.  Cognition Overall Cognitive Status: Impaired/Different from baseline Arousal/Alertness: Awake/alert Orientation Level: Oriented to person;Oriented to place;Oriented to situation(pt unable to select current year even when given a choice of 2) Memory: Impaired Memory Impairment: Decreased recall of new information;Decreased short term memory Awareness: Impaired Awareness Impairment: Anticipatory impairment;Emergent impairment Problem Solving: Impaired Safety/Judgment: Impaired  Sensation Sensation Light Touch: Appears Intact(BLE) Proprioception:  Appears Intact(BLE) Coordination Gross Motor Movements are Fluid and Coordinated: No Heel Shin Test: delayed LLE  Motor  Motor Motor: Hemiplegia Motor - Skilled Clinical Observations: generalized deconditioning   Mobility Bed Mobility Bed Mobility: Supine to Sit;Sit to Supine(in hospital bed) Supine to Sit: Supervision/Verbal cueing Sit to Supine: Supervision/Verbal cueing Transfers Transfers: Stand to Sit;Sit to Stand;Stand Pivot Transfers Sit to Stand: Supervision/Verbal cueing Stand to Sit: Supervision/Verbal cueing(very poor eccentric control) Stand Pivot Transfers: Supervision/Verbal cueing Stand Pivot Transfer Details: Verbal cues for precautions/safety  Locomotion  Gait Ambulation: Yes Gait Assistance: Minimal Assistance - Patient > 75% Gait Distance (Feet): 10 Feet Assistive device: None Gait Gait: Yes Gait Pattern: Impaired Gait Pattern: (decreased step length BLE, decreased stride length, decreased weight shifting ) Gait velocity: decreased Stairs / Additional Locomotion Stairs: No Wheelchair Mobility Wheelchair Mobility: No   Trunk/Postural Assessment  Thoracic  Assessment Thoracic Assessment: (rounded shoulders) Postural Control Postural Control: Deficits on evaluation Righting Reactions: impaired Protective Responses: impaired   Balance Balance Balance Assessed: Yes Dynamic Standing Balance Dynamic Standing - Balance Support: No upper extremity supported Dynamic Standing - Level of Assistance: 4: Min assist Dynamic Standing - Balance Activities: (during gait)  Extremity Assessment  RUE Assessment RUE Assessment: Within Functional Limits LUE Assessment LUE Assessment: Exceptions to [facility] Passive Range of Motion (PROM) Comments: WFL Active Range of Motion (AROM) Comments: decreased shoulder flexion General Strength Comments: impaired grip strength, shoulder flexion RLE Assessment RLE Assessment: Within Functional Limits LLE Assessment LLE  Assessment: Within Functional Limits General Strength Comments: generally 4-/5   See Function Navigator for Current Functional Status.   Refer to Care Plan for Long Term Goals  Recommendations for other services: Neuropsych & Recreational therapy  Discharge Criteria: Patient will be discharged from PT if patient refuses treatment 3 consecutive times without medical reason, if treatment goals not met, if there is a change in medical status, if patient makes no progress towards goals or if patient is discharged from hospital.  The above assessment, treatment plan, treatment alternatives and goals were discussed and mutually agreed upon: by patient  Waunita Schooner 09/06/2017, 11:26 AM

## 2017-09-06 NOTE — Plan of Care (Signed)
LTGs established 09/06/17

## 2017-09-06 NOTE — Progress Notes (Signed)
Pt complained of headache. BP Checked manually. 150/100 noted. Dr. Leanne Chang notified. PRN tylenol given for headache. Continue plan of care.  Luis Wiley

## 2017-09-06 NOTE — Progress Notes (Signed)
Luis Wiley is a 62 y.o. male [birth year] [id number]  Subjective: Patient complains of mild continued headache, unchanged from prior days.  Reports Tylenol effective in improving symptoms.  Also reports coordination of left side continues slow improvement.  No new deficits or concerns reported  Objective: Vital signs in last 24 hours: Temp:  [98 F (36.7 C)-98.6 F (37 C)] 98.6 F (37 C) (08/10 0629) Pulse Rate:  [59-68] 60 (08/10 1052) Resp:  [16-20] 16 (08/10 0629) BP: (150-172)/(94-111) 159/102 (08/10 1052) SpO2:  [98 %-100 %] 100 % (08/10 0629) Weight:  [71.4 kg] 71.4 kg (08/09 1820) Weight change:     Intake/Output from previous day: No intake/output data recorded.  Physical Exam General: No apparent distress    Lungs: Normal effort. Lungs clear to auscultation, no crackles or wheezes. Cardiovascular: Regular rate and rhythm, no edema Neurological: No new neurological deficits: Left-sided deficits arm and leg with slow motor movement as opposed to right.  Strength 3+/5 on left. Wounds: N/A     Lab Results: BMET    Component Value Date/Time   NA 141 09/05/2017 0501   K 3.6 09/05/2017 0501   CL 106 09/05/2017 0501   CO2 22 09/05/2017 0501   GLUCOSE 92 09/05/2017 0501   BUN 16 09/05/2017 0501   CREATININE 1.47 (H) 09/05/2017 0501   CALCIUM 9.4 09/05/2017 0501   GFRNONAA 49 (L) 09/05/2017 0501   GFRAA 57 (L) 09/05/2017 0501   CBC    Component Value Date/Time   WBC 5.5 09/05/2017 0501   RBC 4.42 09/05/2017 0501   HGB 8.4 (L) 09/05/2017 0501   HCT 30.3 (L) 09/05/2017 0501   PLT 297 09/05/2017 0501   MCV 68.6 (L) 09/05/2017 0501   MCH 19.0 (L) 09/05/2017 0501   MCHC 27.7 (L) 09/05/2017 0501   RDW 18.8 (H) 09/05/2017 0501   LYMPHSABS 1.7 09/02/2017 2134   MONOABS 0.5 09/02/2017 2134   EOSABS 0.1 09/02/2017 2134   BASOSABS 0.1 09/02/2017 2134   CBG's (last 3):  No results for input(s): GLUCAP in the last 72 hours. LFT's Lab Results  Component Value Date    ALT 22 09/02/2017   AST 24 09/02/2017   ALKPHOS 104 09/02/2017   BILITOT 0.5 09/02/2017    Studies/Results: No results found.  Medications:  I have reviewed the patient's current medications. Scheduled Medications: . atorvastatin  20 mg Oral q1800  . lisinopril  20 mg Oral Daily  . pantoprazole  40 mg Oral Daily  . senna-docusate  1 tablet Oral BID   PRN Medications: acetaminophen  Assessment/Plan: Principal Problem:   Thalamic hemorrhage (HCC) Active Problems:   Essential hypertension   Tobacco abuse   CKD (chronic kidney disease) stage 3, GFR 30-59 ml/min (HCC)   Length of stay, days: 1  1.  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  Holding anticoagulation because of same.  Continue other risk factor modification with medical management as ongoing.  Continue inpatient rehab therapies as prescribed. 2.  Headache.  Related to problem #1.  Continue Tylenol as needed.  No new neuro deficits.  Also work on blood pressure control post stroke, see next. 3.  Hypertension, uncontrolled.  Will increase ACE inhibitor dose now.  Continue vitals per routine and as needed. 4.  Chronic kidney disease, stage III.  Plan recheck GFR next week to monitor stability on ACE inhibitor. 5 anemia.  No clinical evidence of ongoing bleeding.  We will plan for lab recheck next week as scheduled.  Cathaleen Korol A. Asa Lente, MD 09/06/2017, 11:17 AM

## 2017-09-07 MED ORDER — NICOTINE 14 MG/24HR TD PT24
14.0000 mg | MEDICATED_PATCH | Freq: Every day | TRANSDERMAL | Status: DC
  Administered 2017-09-07 – 2017-09-12 (×6): 14 mg via TRANSDERMAL
  Filled 2017-09-07 (×6): qty 1

## 2017-09-07 NOTE — Plan of Care (Signed)
  Problem: Consults Goal: RH STROKE PATIENT EDUCATION Description See Patient Education module for education specifics  Outcome: Progressing   Problem: RH BOWEL ELIMINATION Goal: RH STG MANAGE BOWEL WITH ASSISTANCE Description STG Manage Bowel with Assistance. Outcome: Progressing Goal: RH STG MANAGE BOWEL W/MEDICATION W/ASSISTANCE Description STG Manage Bowel with Medication with Assistance. Outcome: Progressing   Problem: RH BLADDER ELIMINATION Goal: RH STG MANAGE BLADDER WITH ASSISTANCE Description STG Manage Bladder With Assistance Outcome: Progressing   Problem: RH SAFETY Goal: RH STG ADHERE TO SAFETY PRECAUTIONS W/ASSISTANCE/DEVICE Description STG Adhere to Safety Precautions With Assistance/Device. Outcome: Progressing   Problem: RH COGNITION-NURSING Goal: RH STG USES MEMORY AIDS/STRATEGIES W/ASSIST TO PROBLEM SOLVE Description STG Uses Memory Aids/Strategies With Assistance to Problem Solve. Outcome: Progressing   Problem: RH KNOWLEDGE DEFICIT Goal: RH STG INCREASE KNOWLEDGE OF HYPERTENSION Outcome: Progressing Goal: RH STG INCREASE KNOWLEGDE OF HYPERLIPIDEMIA Outcome: Progressing Goal: RH STG INCREASE KNOWLEDGE OF STROKE PROPHYLAXIS Outcome: Progressing

## 2017-09-07 NOTE — Progress Notes (Signed)
Luis Wiley is a 62 y.o. male 01-28-1956 [id number]  Subjective: When asked about headache, say headache is what woke him up this morning. When asked how head feels now, patient reports "i'm fine". Denies other problems (but seems slightly more distracted than yesterday)  Objective: Vital signs in last 24 hours: Temp:  [98 F (36.7 C)-98.7 F (37.1 C)] 98.7 F (37.1 C) (08/11 0500) Pulse Rate:  [58-80] 70 (08/11 0500) Resp:  [16-21] 21 (08/11 0500) BP: (147-165)/(80-111) 148/103 (08/11 0500) SpO2:  [99 %-100 %] 99 % (08/11 0500) Weight change:  Last BM Date: 09/02/17  Intake/Output from previous day: 08/10 0701 - 08/11 0700 In: 240 [P.O.:240] Out: -   Physical Exam General: No apparent distress   Distracted but can be called to focus with specific questions Lungs: Normal effort. Lungs clear to auscultation, no crackles or wheezes. Cardiovascular: Regular rate and rhythm, no edema Neurological: No new neurological deficits  Lab Results: BMET    Component Value Date/Time   NA 141 09/05/2017 0501   K 3.6 09/05/2017 0501   CL 106 09/05/2017 0501   CO2 22 09/05/2017 0501   GLUCOSE 92 09/05/2017 0501   BUN 16 09/05/2017 0501   CREATININE 1.47 (H) 09/05/2017 0501   CALCIUM 9.4 09/05/2017 0501   GFRNONAA 49 (L) 09/05/2017 0501   GFRAA 57 (L) 09/05/2017 0501   CBC    Component Value Date/Time   WBC 5.5 09/05/2017 0501   RBC 4.42 09/05/2017 0501   HGB 8.4 (L) 09/05/2017 0501   HCT 30.3 (L) 09/05/2017 0501   PLT 297 09/05/2017 0501   MCV 68.6 (L) 09/05/2017 0501   MCH 19.0 (L) 09/05/2017 0501   MCHC 27.7 (L) 09/05/2017 0501   RDW 18.8 (H) 09/05/2017 0501   LYMPHSABS 1.7 09/02/2017 2134   MONOABS 0.5 09/02/2017 2134   EOSABS 0.1 09/02/2017 2134   BASOSABS 0.1 09/02/2017 2134   CBG's (last 3):  No results for input(s): GLUCAP in the last 72 hours. LFT's Lab Results  Component Value Date   ALT 22 09/02/2017   AST 24 09/02/2017   ALKPHOS 104 09/02/2017   BILITOT 0.5 09/02/2017    Studies/Results: No results found.  Medications:  I have reviewed the patient's current medications. Scheduled Medications: . atorvastatin  20 mg Oral q1800  . feeding supplement (ENSURE ENLIVE)  237 mL Oral BID BM  . lisinopril  40 mg Oral Daily  . pantoprazole  40 mg Oral Daily  . senna-docusate  1 tablet Oral BID   PRN Medications: acetaminophen  Assessment/Plan: Principal Problem:   Thalamic hemorrhage (HCC) Active Problems:   Essential hypertension   Tobacco abuse   CKD (chronic kidney disease) stage 3, GFR 30-59 ml/min (HCC)   Anemia   Length of stay, days: 2  1.  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  Holding anticoagulation because of same.  Continue other risk factor modification with medical management as ongoing.  Continue inpatient rehab therapies as prescribed. Also video sitter and fall safety concerns reviewed on chart 2.  Headache.  Related to problem #1.  Continue Tylenol as needed.  No new neuro deficits.  Also work on blood pressure control post stroke, see next. 3.  Hypertension, uncontrolled.  Increased dose ACE inhibitor this morning.  Continue vitals per routine and as needed. 4.  Chronic kidney disease, stage III.  Plan recheck GFR next week to monitor stability on ACE inhibitor. 5 anemia.  No clinical evidence of ongoing bleeding.  plan for lab recheck next week as scheduled.  Carnel Stegman A. Asa Lente, MD 09/07/2017, 9:27 AM

## 2017-09-08 ENCOUNTER — Inpatient Hospital Stay (HOSPITAL_COMMUNITY): Payer: Medicaid Other | Admitting: Occupational Therapy

## 2017-09-08 ENCOUNTER — Inpatient Hospital Stay (HOSPITAL_COMMUNITY): Payer: Self-pay | Admitting: Occupational Therapy

## 2017-09-08 ENCOUNTER — Inpatient Hospital Stay (HOSPITAL_COMMUNITY): Payer: Self-pay | Admitting: Physical Therapy

## 2017-09-08 ENCOUNTER — Inpatient Hospital Stay (HOSPITAL_COMMUNITY): Payer: Self-pay

## 2017-09-08 LAB — COMPREHENSIVE METABOLIC PANEL
ALBUMIN: 3.4 g/dL — AB (ref 3.5–5.0)
ALK PHOS: 75 U/L (ref 38–126)
ALT: 16 U/L (ref 0–44)
ANION GAP: 9 (ref 5–15)
AST: 15 U/L (ref 15–41)
BILIRUBIN TOTAL: 0.6 mg/dL (ref 0.3–1.2)
BUN: 13 mg/dL (ref 8–23)
CALCIUM: 8.8 mg/dL — AB (ref 8.9–10.3)
CO2: 23 mmol/L (ref 22–32)
Chloride: 106 mmol/L (ref 98–111)
Creatinine, Ser: 1.47 mg/dL — ABNORMAL HIGH (ref 0.61–1.24)
GFR calc Af Amer: 57 mL/min — ABNORMAL LOW (ref >60)
GFR calc non Af Amer: 49 mL/min — ABNORMAL LOW (ref >60)
GLUCOSE: 93 mg/dL (ref 70–99)
Potassium: 3.5 mmol/L (ref 3.5–5.1)
Sodium: 138 mmol/L (ref 135–145)
TOTAL PROTEIN: 6.8 g/dL (ref 6.5–8.1)

## 2017-09-08 LAB — CBC WITH DIFFERENTIAL/PLATELET
BASOS ABS: 0.1 10*3/uL (ref 0.0–0.1)
BASOS PCT: 1 %
EOS PCT: 4 %
Eosinophils Absolute: 0.2 10*3/uL (ref 0.0–0.7)
HEMATOCRIT: 30.9 % — AB (ref 39.0–52.0)
HEMOGLOBIN: 8.6 g/dL — AB (ref 13.0–17.0)
LYMPHS PCT: 29 %
Lymphs Abs: 1.5 10*3/uL (ref 0.7–4.0)
MCH: 18.8 pg — ABNORMAL LOW (ref 26.0–34.0)
MCHC: 27.8 g/dL — AB (ref 30.0–36.0)
MCV: 67.6 fL — ABNORMAL LOW (ref 78.0–100.0)
MONOS PCT: 11 %
Monocytes Absolute: 0.6 10*3/uL (ref 0.1–1.0)
NEUTROS ABS: 2.6 10*3/uL (ref 1.7–7.7)
Neutrophils Relative %: 55 %
Platelets: 319 10*3/uL (ref 150–400)
RBC: 4.57 MIL/uL (ref 4.22–5.81)
RDW: 18.9 % — ABNORMAL HIGH (ref 11.5–15.5)
WBC: 5 10*3/uL (ref 4.0–10.5)

## 2017-09-08 LAB — PSA: Prostatic Specific Antigen: 1 ng/mL (ref 0.00–4.00)

## 2017-09-08 LAB — IRON AND TIBC
Iron: 23 ug/dL — ABNORMAL LOW (ref 45–182)
Saturation Ratios: 5 % — ABNORMAL LOW (ref 17.9–39.5)
TIBC: 430 ug/dL (ref 250–450)
UIBC: 407 ug/dL

## 2017-09-08 MED ORDER — POLYETHYLENE GLYCOL 3350 17 G PO PACK
17.0000 g | PACK | Freq: Every day | ORAL | Status: DC | PRN
Start: 2017-09-08 — End: 2017-09-12
  Administered 2017-09-08 – 2017-09-09 (×2): 17 g via ORAL
  Filled 2017-09-08 (×2): qty 1

## 2017-09-08 NOTE — Progress Notes (Signed)
Physical Therapy Session Note  Patient Details  Name: Luis Wiley MRN: [id number] Date of Birth: September 21, 1955  Today's Date: 09/08/2017 PT Individual Time: 0802-0902 PT Individual Time Calculation (min): 60 min   Short Term Goals: Week 1:  PT Short Term Goal 1 (Week 1): STG = LTG due to short ELOS.  Skilled Therapeutic Interventions/Progress Updates:     Patient received in bed, pleasant and willing to work with PT this morning. He was able to complete all bed mobility without physical assistance, however did require Min-ModA for problem solving when donning pants/shirt as well as MinA for donning pants over L LE at EOB. Worked on dynamic balance at the edge of the sink by washing face, brushing teeth, etc without HHA and MinA for balance, also Min-ModA for problem solving. Gait trained approximately 163f with no device, MInA but Mod cues as patient appeared to have difficulty in attending to L side today and required cues for obstacle navigation in hallway. Further worked on balance by standing on foam pad for playing tic-tac-toe, Mod-Max cues required for problem solving with this activity, also assist for balance increased from MinA to MPittmanwith lean to L as fatigue increased. He was taken back to his room totalA in the wheelchair and was left up in the chair with lap alarm on/activated and all needs otherwise met this morning.   Therapy Documentation Precautions:  Precautions Precautions: Fall Precaution Comments: monitor BP Restrictions Weight Bearing Restrictions: No General:   Vital Signs: BP 158/100 at rest, to 148/103 with activity   Pain: 0/10     See Function Navigator for Current Functional Status.   Therapy/Group: Individual Therapy   KDeniece ReePT, DPT, CBIS  Supplemental Physical Therapist [facility]  Pager [phone]   09/08/2017, 10:10 AM

## 2017-09-08 NOTE — Progress Notes (Signed)
Speech Language Pathology Daily Session Note  Patient Details  Name: Luis Wiley MRN: [id number] Date of Birth: 26-Sep-1955  Today's Date: 09/08/2017 SLP Individual Time: 1000-1100 SLP Individual Time Calculation (min): 60 min  Short Term Goals: Week 1: SLP Short Term Goal 1 (Week 1): STGs=LTGs due to short length of stay   Skilled Therapeutic Interventions:Skilled ST services focused on cognitive skills. SLP facilitated administration of cognitive linguistic assessment, MOCA versions 7.1, pt scored 6 out 30 (N=>26) indicating severe impairments with deficits in primarily delayed processing, sustained attention, and short term recall, impacting basic problem solving, awareness, orientation and language. Pt demonstrated fluctuating intellectual awareness and requiring max-mod A verbal cues to recognize functional errors. Pt required max-mod A verbal cues for sustained attention in 1 minute intervals. SLP created visual aids posted in room for orientation of place and time, pt demonstrated ability to read/comprehned signs. Pt was left in room with call bell within reach and chair alarm set. Recommend to continue skilled ST services.      Function:  Eating Eating   Modified Consistency Diet: No Eating Assist Level: Set up assist for   Eating Set Up Assist For: Opening containers;Cutting food       Cognition Comprehension Comprehension assist level: Understands basic 25 - 49% of the time/ requires cueing 50 - 75% of the time  Expression   Expression assist level: Expresses basic 25 - 49% of the time/requires cueing 50 - 75% of the time. Uses single words/gestures.  Social Interaction Social Interaction assist level: Interacts appropriately 25 - 49% of time - Needs frequent redirection.  Problem Solving Problem solving assist level: Solves basic 25 - 49% of the time - needs direction more than half the time to initiate, plan or complete simple activities  Memory Memory assist level:  Recognizes or recalls 25 - 49% of the time/requires cueing 50 - 75% of the time;Recognizes or recalls less than 25% of the time/requires cueing greater than 75% of the time    Pain Pain Assessment Pain Score: 0-No pain  Therapy/Group: Individual Therapy  Akshaj Besancon  [facility] 09/08/2017, 4:37 PM

## 2017-09-08 NOTE — Progress Notes (Signed)
Occupational Therapy Session Note  Patient Details  Name: Luis Wiley MRN: [id number] Date of Birth: 05/01/1955  Today's Date: 09/08/2017 OT Individual Time: 1115-1200 OT Individual Time Calculation (min): 45 min    Short Term Goals: Week 1:  OT Short Term Goal 1 (Week 1): STGs=LTGs due to ELOS  Skilled Therapeutic Interventions/Progress Updates:    Pt received in w/c already dressed for the day.  He will need to bathe but may be able to do that during his last session of the day. Pt did not want to bathe at this time. Therapy session focused on L side coordination and standing balance.   Pt stood from w/c with steadying A and ambulated to doorway with very slow small steps dragging left foot.  He also tends to hold L arm flexed at elbow which he stated is because his hand gets numb when it hangs down.  He then walked back to chair with min A. Pt transported to gym in w/c.  Leg rests adjusted to longer length. Pt worked on standing balance at parallel bars working on alternating steps forward and stepping L foot out and in with steadying A.  He also worked on AROM of LUE and is able to reach arm over head.  Pt moves slowly but was able to pick up speed with increased repetitions of movements.  Pt taken back to room for lunch. Chair alarm seat belt on and all needs met.  Therapy Documentation Precautions:  Precautions Precautions: Fall Precaution Comments: monitor BP Restrictions Weight Bearing Restrictions: No  Pain: Pain Assessment Pain Score: 0-No pain ADL: ADL ADL Comments: Please see functional navigator for ADL status  See Function Navigator for Current Functional Status.   Therapy/Group: Individual Therapy  , 09/08/2017, 12:54 PM 

## 2017-09-08 NOTE — Progress Notes (Signed)
Social Work  Social Work Assessment and Plan  Patient Details  Name: Luis Wiley MRN: [id number] Date of Birth: 05/21/1955  Today's Date: 09/08/2017  Problem List:  Patient Active Problem List   Diagnosis Date Noted  . Tobacco abuse 09/06/2017  . CKD (chronic kidney disease) stage 3, GFR 30-59 ml/min (HCC) 09/06/2017  . Anemia 09/06/2017  . 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 Essential hypertension   . ICH (intracerebral hemorrhage) ([facility]) 09/02/2017   Past Medical History: History reviewed. No pertinent past medical history. Past Surgical History: History reviewed. No pertinent surgical history. Social History:  reports that he has been smoking cigarettes. He has a 10.00 pack-year smoking history. He has never used smokeless tobacco. His alcohol and drug histories are not on file.  Family / Support Systems Marital Status: Single Patient Roles: Other (Comment)(sibling) Other Supports: Gaetano Hawthorne [phone] Anticipated Caregiver: Self and sister Ability/Limitations of Caregiver: Sister works during the day so needs to be mod/i before going home Caregiver Availability: Evenings only Family Dynamics: Close with his sister and her family. He resides with his sister her husband and husband's mother. He has friends who he hangs around with and feels they are supportive.  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 He does want his sister involved in any decisions while here   Abuse/Neglect Abuse/Neglect Assessment Can Be Completed: Yes Physical Abuse: Denies Verbal Abuse: Denies Sexual Abuse: Denies Exploitation of patient/patient's resources: Denies Self-Neglect: Denies  Emotional Status Pt's  affect, behavior adn adjustment status: Pt has always been independent and able to take care of himself, he is not one who asks for assistance and probably will not when he goes home. He certainly will not bother his brother in-law's mom. She is elderly and cares for herself. Recent Psychosocial Issues: no issues Pyschiatric History: No history deferred depression screen due to tired but would benefit from seeing neuro-psych while here. He is young and would benefit from assist with coping. Substance Abuse History: Tobacco aware recommendation to quit smoking unsure if he will. Aware of the community resources available for this  Patient / Family Perceptions, Expectations & Goals Pt/Family understanding of illness & functional limitations: Pt is able to explain his stroke and deficits. He feels he is getting better and wants to be able to move around his room when he wants too. But staff do not feel he is ready to do this. He hopes to go home soon. Premorbid pt/family roles/activities: Brother, friend, etc Anticipated changes in roles/activities/participation: resume Pt/family expectations/goals: Pt states: " I hope to go home soon, there are too many rules here."  Sister states: " I hope he does well here, he will be alone at home while we work."  [employer]: None Premorbid Home Care/DME Agencies: None Transportation available at discharge: Sister Resource referrals recommended: Neuropsychology, Support group (specify)  Discharge Planning Living Arrangements: Other relatives Support Systems: Other relatives, Friends/neighbors Type of Residence: Private residence Insurance underwriter Resources: Teacher, adult education Resources: Family Support Financial Screen Referred: Yes Living Expenses: Lives with family Money Management: Family Does the patient have any problems obtaining your medications?: No(was not seeing a MD prior to admission) Home Management: Sister Patient/Family  Preliminary Plans: Return home with sister and her husband along with husband's mother. Both sister and her husband work  during the day and pt will be alone since the elderly mom can not assist him either. Pt will need to be mod/i due to being alone during the day at home by discharge. Sw Barriers to Discharge: Decreased caregiver support, Medication compliance Sw Barriers to Discharge Comments: Will be alone during the day and was not going to a MD prior to admission Social Work Anticipated Follow Up Needs: HH/OP, Support Group  Clinical Impression Pleasant gentleman who is motivated to do well and get home soon. He feels he already can manage at home but the staff feel otherwise and he is not ready to be mod/i in his room. His sister and brother in-law work during the day and will only be there in the evenings. Do feel he would benefit from seeing neuro-psych while here.  Elease Hashimoto 09/08/2017, 2:12 PM

## 2017-09-08 NOTE — Progress Notes (Signed)
Occupational Therapy Session Note  Patient Details  Name: Luis Wiley MRN: [id number] Date of Birth: 01-19-1956  Today's Date: 09/08/2017 OT Individual Time: 1250-1344 OT Individual Time Calculation (min): 54 min    Short Term Goals: Week 1:  OT Short Term Goal 1 (Week 1): STGs=LTGs due to ELOS  Skilled Therapeutic Interventions/Progress Updates:    Upon entering the room, pt seated in wheelchair with no c/o pain and agreeable to OT intervention this session. Pt performed sit >stand from wheelchair with supervision and ambulated 10' into bathroom with min hand held assistance. Pt seated on toilet but unable to void. Pt doffing clothing items while seated with assistance for LB clothing secondary to time management. Pt transferring to sit on TTB with min A and use of grab bar. Pt requiring increased time to complete all functional tasks this session. Pt demonstrating perseveration with washing B LEs multiple times after rinsing off and needing mod multimodal cuing for redirection. Pt standing with min A to wash buttocks and peri area. Pt able to don B socks with figure four position while seated on edge of TTB with steady assistance for balance. Pt donning hospital gown and returning to wheelchair. Alarm belt donned for safety with call bell and all needed items within reach.   Therapy Documentation Precautions:  Precautions Precautions: Fall Precaution Comments: monitor BP Restrictions Weight Bearing Restrictions: No General:   Vital Signs: Therapy Vitals Temp: 98.5 F (36.9 C) Pulse Rate: 63 Resp: (!) 21 BP: (!) 145/104 Patient Position (if appropriate): Sitting Oxygen Therapy SpO2: 100 % O2 Device: Room Air Pain: Pain Assessment Pain Score: 0-No pain ADL: ADL ADL Comments: Please see functional navigator for ADL status  See Function Navigator for Current Functional Status.   Therapy/Group: Individual Therapy  Gypsy Decant 09/08/2017, 1:46 PM

## 2017-09-08 NOTE — Progress Notes (Signed)
Subjective/Complaints:   Objective: Vital Signs: Blood pressure (!) 158/100, pulse 67, temperature 98.3 F (36.8 C), temperature source Oral, resp. rate 18, height 6' (1.829 m), weight 71.4 kg, SpO2 96 %. No results found. Results for orders placed or performed during the hospital encounter of 09/05/17 (from the past 72 hour(s))  CBC WITH DIFFERENTIAL     Status: Abnormal (Preliminary result)   Collection Time: 09/08/17  6:13 AM  Result Value Ref Range   WBC 5.0 4.0 - 10.5 K/uL   RBC 4.57 4.22 - 5.81 MIL/uL   Hemoglobin 8.6 (L) 13.0 - 17.0 g/dL   HCT 30.9 (L) 39.0 - 52.0 %   MCV 67.6 (L) 78.0 - 100.0 fL   MCH 18.8 (L) 26.0 - 34.0 pg   MCHC 27.8 (L) 30.0 - 36.0 g/dL   RDW 18.9 (H) 11.5 - 15.5 %   Platelets 319 150 - 400 K/uL    Comment: Performed at [facility], [place] [address], [place], [place] [address]   Neutrophils Relative % PENDING %   Neutro Abs PENDING 1.7 - 7.7 K/uL   Band Neutrophils PENDING %   Lymphocytes Relative PENDING %   Lymphs Abs PENDING 0.7 - 4.0 K/uL   Monocytes Relative PENDING %   Monocytes Absolute PENDING 0.1 - 1.0 K/uL   Eosinophils Relative PENDING %   Eosinophils Absolute PENDING 0.0 - 0.7 K/uL   Basophils Relative PENDING %   Basophils Absolute PENDING 0.0 - 0.1 K/uL   WBC Morphology PENDING    RBC Morphology PENDING    Smear Review PENDING    nRBC PENDING 0 /100 WBC   Metamyelocytes Relative PENDING %   Myelocytes PENDING %   Promyelocytes Relative PENDING %   Blasts PENDING %  Comprehensive metabolic panel     Status: Abnormal   Collection Time: 09/08/17  6:13 AM  Result Value Ref Range   Sodium 138 135 - 145 mmol/L   Potassium 3.5 3.5 - 5.1 mmol/L   Chloride 106 98 - 111 mmol/L   CO2 23 22 - 32 mmol/L   Glucose, Bld 93 70 - 99 mg/dL   BUN 13 8 - 23 mg/dL   Creatinine, Ser 1.47 (H) 0.61 - 1.24 mg/dL   Calcium 8.8 (L) 8.9 - 10.3 mg/dL   Total Protein 6.8 6.5 - 8.1 g/dL   Albumin 3.4 (L) 3.5 - 5.0 g/dL   AST 15 15 - 41 U/L   ALT  16 0 - 44 U/L   Alkaline Phosphatase 75 38 - 126 U/L   Total Bilirubin 0.6 0.3 - 1.2 mg/dL   GFR calc non Af Amer 49 (L) >60 mL/min   GFR calc Af Amer 57 (L) >60 mL/min    Comment: (NOTE) The eGFR has been calculated using the CKD EPI equation. This calculation has not been validated in all clinical situations. eGFR's persistently <60 mL/min signify possible Chronic Kidney Disease.    Anion gap 9 5 - 15    Comment: Performed at [facility] [address], [place], [place] [address]     HEENT: normal Cardio: RRR and no murmur Resp: CTA B/L and unlabored GI: BS positive and NT, ND Extremity:  No Edema Skin:   Intact Neuro: Confused, Flat, Abnormal Motor 4/5 in BUE and BLE, Abnormal FMC Ataxic/ dec FMC and Other Slowed responses, oriented to person, hosp not [facility] or to time Musc/Skel:  Normal and Other no pain with UE or LE ROM Gen NAD  Assessment/Plan: 1. 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 which require 3+ hours per day of interdisciplinary therapy in a comprehensive inpatient rehab setting. Physiatrist is providing close team supervision and 24 hour management of active medical problems listed below. Physiatrist and rehab team continue to assess barriers to discharge/monitor patient progress toward functional and medical goals. FIM: Function - Bathing Position: Shower Body parts bathed by patient: Right arm, Left arm, Chest, Abdomen, Front perineal area, Buttocks, Right upper leg, Left upper leg, Right lower leg, Left lower leg Body parts bathed by helper: Back Assist Level: Touching or steadying assistance(Pt > 75%)(for standing balance)  Function- Upper Body Dressing/Undressing What is the patient wearing?: Pull over shirt/dress Pull over shirt/dress - Perfomed by patient: Thread/unthread right sleeve, Thread/unthread left sleeve, Put head through opening, Pull shirt over trunk Assist Level: Supervision or verbal cues Function - Lower Body  Dressing/Undressing What is the patient wearing?: Underwear, Pants, Socks, Shoes Position: Sitting EOB Underwear - Performed by patient: Thread/unthread right underwear leg, Thread/unthread left underwear leg, Pull underwear up/down Pants- Performed by patient: Thread/unthread right pants leg, Thread/unthread left pants leg, Pull pants up/down, Fasten/unfasten pants Socks - Performed by patient: Don/doff right sock, Don/doff left sock Shoes - Performed by patient: Don/doff right shoe, Don/doff left shoe, Fasten right, Fasten left Assist for footwear: Supervision/touching assist Assist for lower body dressing: Touching or steadying assistance (Pt > 75%)(for standing balance)  Function - Toileting Toileting activity did not occur: No continent bowel/bladder event Toileting steps completed by helper: Adjust clothing prior to toileting, Performs perineal hygiene, Adjust clothing after toileting Assist level: Touching or steadying assistance (Pt.75%)  Function - Air cabin crew transfer assistive device: Grab bar Assist level to toilet: Touching or steadying assistance (Pt > 75%) Assist level from toilet: Touching or steadying assistance (Pt > 75%)  Function - Chair/bed transfer Chair/bed transfer method: Stand pivot Chair/bed transfer assist level: Supervision or verbal cues(very poor eccentric control with stand>sit) Chair/bed transfer assistive device: Armrests Chair/bed transfer details: Verbal cues for precautions/safety  Function - Locomotion: Wheelchair Will patient use wheelchair at discharge?: No Wheelchair activity did not occur: Safety/medical concerns Wheel 50 feet with 2 turns activity did not occur: Safety/medical concerns Wheel 150 feet activity did not occur: Safety/medical concerns Function - Locomotion: Ambulation Assistive device: No device Max distance: 10 ft  Assist level: Touching or steadying assistance (Pt > 75%) Assist level: Touching or steadying  assistance (Pt > 75%) Assist level: No help, No cues, assistive device, takes more than a reasonable amount of time Assist level: No help, No cues, assistive device, takes more than a reasonable amount of time Assist level: No help, No cues, assistive device, takes more than a reasonable amount of time  Function - Comprehension Comprehension: Auditory Comprehension assist level: Understands basic 50 - 74% of the time/ requires cueing 25 - 49% of the time  Function - Expression Expression: Verbal Expression assist level: Expresses basic 50 - 74% of the time/requires cueing 25 - 49% of the time. Needs to repeat parts of sentences.  Function - Social Interaction Social Interaction assist level: Interacts appropriately 50 - 74% of the time - May be physically or verbally inappropriate.  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 DVT Prophylaxis/Anticoagulation: SCDs. Monitor for any signs of DVT 3. Pain Management:Tylenol as needed 4. Mood:Provide emotional support 5. Neuropsych: This patientiscapable of making decisions on hisown behalf. 6. Skin/Wound Care:Routine skin checks 7. Fluids/Electrolytes/Nutrition:Routine in and outs with follow-up chemistries 8.Hypertension. Monitor with increased mobility. VS per routine Vitals:   09/07/17 1948 09/08/17 0609  BP: (!) 150/95 (!) 158/100  Pulse: 75 67  Resp: 18 18  Temp: 99.3 F (37.4 C) 98.3 F (36.8 C)  SpO2: 99% 96%  sys BP goal <160 9.Tobacco abuse. 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 Follow-up chemistries, stable values 11. Microcytic anemia- will check Fe studies and guaic 12.  Urinary freq was  having some inc prior to CVA with freq urination not checked by MD will check PSA, PVRs LOS (Days) 3 A FACE TO FACE EVALUATION WAS PERFORMED  Charlett Blake 09/08/2017, 7:39 AM

## 2017-09-08 NOTE — Care Management Note (Signed)
[facility] Individual Statement of Services  Patient Name:  Luis Wiley  Date:  09/08/2017  Welcome to the [facility].  Our goal is to provide you with an individualized program based on your diagnosis and situation, designed to meet your specific needs.  With this comprehensive rehabilitation program, you will be expected to participate in at least 3 hours of rehabilitation therapies Monday-Friday, with modified therapy programming on the weekends.  Your rehabilitation program will include the following services:  Physical Therapy (PT), Occupational Therapy (OT), Speech Therapy (ST), 24 hour per day rehabilitation nursing, Neuropsychology, Case Management (Social Worker), Rehabilitation Medicine, Nutrition Services and Pharmacy Services  Weekly team conferences will be held on Wednesday to discuss your progress.  Your Social Worker will talk with you frequently to get your input and to update you on team discussions.  Team conferences with you and your family in attendance may also be held.  Expected length of stay: 7-10 days  Overall anticipated outcome: supervision-independent with device  Depending on your progress and recovery, your program may change. Your Social Worker will coordinate services and will keep you informed of any changes. Your Social Worker's name and contact numbers are listed  below.  The following services may also be recommended but are not provided by the [facility] will be made to provide these services after discharge if needed.  Arrangements include referral to agencies that provide these services.  Your insurance has been verified to be:  Self Pay Your primary doctor is:  [facility] new pt appointment-8/20  Pertinent information will be shared with your doctor and your  insurance company.  Social Worker:  Ovidio Kin, [place] or ([phone]  Information discussed with and copy given to patient by: Elease Hashimoto, 09/08/2017, 11:39 AM

## 2017-09-08 NOTE — IPOC Note (Signed)
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[facility])  Hospital Problems: Principal Problem:   Thalamic hemorrhage ([place]) Active Problems:   Essential hypertension   Tobacco abuse   CKD (chronic kidney disease) stage 3, GFR 30-59 ml/min (HCC)   Anemia     Functional Problem List: Nursing    PT Balance, Pain, Behavior, Perception, Endurance, Safety, Motor  OT Balance, Cognition, Endurance, Motor, Perception, Safety, Skin Integrity(skin integrity due to incontinence)  SLP Cognition  TR         Basic ADL's: OT Grooming, Bathing, Dressing, Toileting     Advanced  ADL's: OT Simple Meal Preparation     Transfers: PT Bed Mobility, Bed to Chair, Car, Furniture, Floor  OT Toilet, Tub/Shower     Locomotion: PT Ambulation, Stairs     Additional Impairments: OT None  SLP Social Cognition   Social Interaction, Problem Solving, Memory, Attention, Awareness  TR      Anticipated Outcomes Item Anticipated Outcome  Self Feeding No goal  Swallowing      Basic self-care  Supervision/cuing  Toileting  Supervision/cuing   Bathroom Transfers Supervision/cuing   Bowel/Bladder     Transfers  mod I with LRAD  Locomotion  supervision<>mod I with LRAD  Communication     Cognition  Min A   Pain     Safety/Judgment      Therapy Plan: PT Intensity: Minimum of 1-2 x/day ,45 to 90 minutes PT Frequency: 5 out of 7 days PT Duration Estimated Length of Stay: 1 week OT Intensity: Minimum of 1-2 x/day, 45 to 90 minutes OT Frequency: 5 out of 7 days OT Duration/Estimated Length of Stay: 7-10 days SLP Intensity: Minumum of 1-2 x/day, 30 to 90 minutes SLP Frequency: 3 to 5 out of 7 days SLP Duration/Estimated Length of Stay: 7-10 days     Team Interventions: Nursing Interventions    PT interventions Ambulation/gait training, Cognitive remediation/compensation, Discharge planning,  DME/adaptive equipment instruction, Functional mobility training, Pain management, Psychosocial support, Splinting/orthotics, Therapeutic Activities, UE/LE Strength taining/ROM, Visual/perceptual remediation/compensation, UE/LE Coordination activities, Therapeutic Exercise, Stair training, Skin care/wound management, Patient/family education, Neuromuscular re-education, Functional electrical stimulation, Disease management/prevention, Academic librarian, Training and development officer  OT Interventions Training and development officer, Engineer, drilling, Patient/family education, Therapeutic Activities, Wheelchair propulsion/positioning, Cognitive remediation/compensation, Psychosocial support, Therapeutic Exercise, Community reintegration, Functional mobility training, Self Care/advanced ADL retraining, UE/LE Strength taining/ROM, Discharge planning, Neuromuscular re-education, Skin care/wound managment, UE/LE Coordination activities, Visual/perceptual remediation/compensation, Pain management, Disease mangement/prevention  SLP Interventions Cognitive remediation/compensation, Internal/external aids, Therapeutic Activities, Patient/family education, Functional tasks, Cueing hierarchy  TR Interventions    SW/CM Interventions Discharge Planning, Psychosocial Support, Patient/Family Education   Barriers to Discharge MD  Medical stability and cognition  Nursing      PT Decreased caregiver support, Lack of/limited family support pt's sister Luis Wiley) works during the day  OT Decreased caregiver support, Lack of/limited family support, Incontinence    SLP      SW Decreased caregiver support, Medication compliance Will be alone during the day and was not going to a MD prior to admission   Team Discharge Planning: Destination: PT-Home ,OT- Home , SLP-Home Projected Follow-up: PT-(HHPT vs OPPT), OT-  Home health OT, SLP-Home Health SLP, 24 hour supervision/assistance, Outpatient  SLP Projected Equipment Needs: PT-To be determined, OT- To be determined, SLP-None recommended by SLP Equipment Details: PT- , OT-  Patient/family involved in discharge planning: PT- Patient,  OT-Patient, SLP-Patient  MD ELOS: 7-10d  Medical Rehab Prognosis:  Good Assessment:  of 61 year old right-handed male history of tobacco abuse. Per chart review he lives with his sister. One level home with 3 steps to entry. Independent prior to admission. Presented 09/02/2017 with left-sided weakness and altered mental status with aphasia. Blood pressure 532 systolic per EMS. 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 Patient did receive TPA. CT angiogram of head and neck showed widespread intracranial atherosclerosis without large vessel occlusion. Echocardiogram with ejection fraction of 02% grade 1 diastolic dysfunction. Creatinine mildly elevated on admission 1.50 felt to be baseline and monitored. Neurology follow-up advise conservative care close monitoring of blood pressure   Now requiring 24/7 Rehab RN,MD, as well as CIR level PT, OT and SLP.  Treatment team will focus on ADLs and mobility with goals set at Sup  See Team Conference Notes for weekly updates to the plan of care

## 2017-09-09 ENCOUNTER — Inpatient Hospital Stay (HOSPITAL_COMMUNITY): Payer: Self-pay

## 2017-09-09 ENCOUNTER — Inpatient Hospital Stay (HOSPITAL_COMMUNITY): Payer: Medicaid Other | Admitting: Speech Pathology

## 2017-09-09 ENCOUNTER — Encounter (HOSPITAL_COMMUNITY): Payer: Self-pay | Admitting: Psychology

## 2017-09-09 ENCOUNTER — Inpatient Hospital Stay (HOSPITAL_COMMUNITY): Payer: Self-pay | Admitting: Physical Therapy

## 2017-09-09 MED ORDER — SORBITOL 70 % SOLN
30.0000 mL | Freq: Every day | Status: DC | PRN
Start: 2017-09-09 — End: 2017-09-12
  Administered 2017-09-11: 30 mL via ORAL
  Filled 2017-09-09: qty 30

## 2017-09-09 NOTE — Progress Notes (Signed)
Physical Therapy Session Note  Patient Details  Name: Luis Wiley MRN: [id number] Date of Birth: 01-28-1956  Today's Date: 09/09/2017 PT Individual Time: 0900-0955 PT Individual Time Calculation (min): 55 min   Short Term Goals: Week 1:  PT Short Term Goal 1 (Week 1): STG = LTG due to short ELOS.  Skilled Therapeutic Interventions/Progress Updates:    Pt seated in w/c upon PT arrival, agreeable to therapy tx and denies pain. Pt transported from room>gym. Pt ambulated 2 x 85 ft without AD, min assist with verbal cues for increased step length and manual facilitation for lateral weightshift. Pt worked on dynamic standing balance standing on airex while tossing horseshoes x 2 trials, performing toe taps on 4 inch step x2 trials and sidestepping 2 x 20 ft. Pt worked on LE strengthening to performed x 10 sit<>stands from the mat with verbal cues for techniques. Pt transferred from mat>w/c with min assist stand pivot. Pt transported to the dayroom. Pt transferred to nustep and used x 6 minutes on workload 5 for strengthening and endurance. Pt transferred back to w/c and transported back to room, left seated with chair alarm set and needs in reach.   Therapy Documentation Precautions:  Precautions Precautions: Fall Precaution Comments: monitor BP Restrictions Weight Bearing Restrictions: No   See Function Navigator for Current Functional Status.   Therapy/Group: Individual Therapy  Netta Corrigan, PT, DPT 09/09/2017, 7:56 AM

## 2017-09-09 NOTE — Progress Notes (Signed)
Occupational Therapy Session Note  Patient Details  Name: Luis Wiley MRN: [id number] Date of Birth: 19-May-1955  Today's Date: 09/09/2017 OT Individual Time: 1100-1155 OT Individual Time Calculation (min): 55 min    Short Term Goals: Week 1:  OT Short Term Goal 1 (Week 1): STGs=LTGs due to ELOS  Skilled Therapeutic Interventions/Progress Updates:    OT intervention with focus on task initiation, sequencing, problem solving, functional amb, and pathfinding. Pt amb without AD to day room and challenged with replicating simple pattern on colored peg board.  Pt required max verbal cues to complete task with more than a reasonable amount of time.  Pt unable to successfully complete task without assistance. Pt amb to gym while carrying container before amb back to room.  Pt required more than a reasonable amount of time to return to room and required max verbal cues to locate room.  Pt returned to w/c with belt alarm activated and all needs within reach.   Therapy Documentation Precautions:  Precautions Precautions: Fall Precaution Comments: monitor BP Restrictions Weight Bearing Restrictions: No Pain:  Pt denies pain  See Function Navigator for Current Functional Status.   Therapy/Group: Individual Therapy  [name] 09/09/2017, 12:00 PM

## 2017-09-09 NOTE — Progress Notes (Signed)
Occupational Therapy Session Note  Patient Details  Name: Luis Wiley MRN: [id number] Date of Birth: 1955-07-18  Today's Date: 09/09/2017 OT Individual Time: 0700-0759 OT Individual Time Calculation (min): 59 min    Short Term Goals: Week 1:  OT Short Term Goal 1 (Week 1): STGs=LTGs due to ELOS  Skilled Therapeutic Interventions/Progress Updates:    Pt engaged in BADL retraining with focus on task initiation, standing balance, sequencing, and safety awareness to increase independence with BADLs.  Pt required mod verbal cues to initiate tasks and more than a reasonable amount of time to complete all tasks.  Pt required assistance threading RLE into pants and steady A when pulling up pants.  Pt remained in w/c with bel alarm activated and breakfast placed on table in front. All needs within reach.   Therapy Documentation Precautions:  Precautions Precautions: Fall Precaution Comments: monitor BP Restrictions Weight Bearing Restrictions: No Pain:  Pt denies pain  See Function Navigator for Current Functional Status.   Therapy/Group: Individual Therapy  [name] 09/09/2017, 8:03 AM

## 2017-09-09 NOTE — Progress Notes (Signed)
Speech Language Pathology Daily Session Note  Patient Details  Name: Luis Wiley MRN: [id number] Date of Birth: Feb 19, 1955  Today's Date: 09/09/2017 SLP Individual Time: 6440-3474 SLP Individual Time Calculation (min): 25 min  Short Term Goals: Week 1: SLP Short Term Goal 1 (Week 1): STGs=LTGs due to short length of stay   Skilled Therapeutic Interventions: Skilled treatment session focused on cognitive goals. SLP facilitated session by providing extra time and Min A verbal cues for attention while sorting coins from a field of 4. However, patient required Max A verbal cues counting change in regards to problem solving. Patient required Mod-Max A verbal cues for initiation and attention and continues to require Max A multimodal cues for emergent awareness throughout tasks. Patient left upright in wheelchair with alarm on and all needs within reach. Continue with current plan of care.      Function:   Cognition Comprehension Comprehension assist level: Understands basic 50 - 74% of the time/ requires cueing 25 - 49% of the time  Expression   Expression assist level: Expresses basic 50 - 74% of the time/requires cueing 25 - 49% of the time. Needs to repeat parts of sentences.  Social Interaction Social Interaction assist level: Interacts appropriately 25 - 49% of time - Needs frequent redirection.  Problem Solving Problem solving assist level: Solves basic 25 - 49% of the time - needs direction more than half the time to initiate, plan or complete simple activities  Memory Memory assist level: Recognizes or recalls 25 - 49% of the time/requires cueing 50 - 75% of the time    Pain No/Denies Pain   Therapy/Group: Individual Therapy  Lasalle Abee 09/09/2017, 2:32 PM

## 2017-09-09 NOTE — Progress Notes (Deleted)
Pt LBM 8-6. Pt did receive PRN miralaxx and scheduled senna with refusing PRN sorbitol or laxative. RN educated on importance of regular BMs with pt refusing any further interventions at this time.

## 2017-09-10 ENCOUNTER — Inpatient Hospital Stay (HOSPITAL_COMMUNITY): Payer: Self-pay

## 2017-09-10 ENCOUNTER — Inpatient Hospital Stay (HOSPITAL_COMMUNITY): Payer: Self-pay | Admitting: Occupational Therapy

## 2017-09-10 ENCOUNTER — Inpatient Hospital Stay (HOSPITAL_COMMUNITY): Payer: Self-pay | Admitting: Speech Pathology

## 2017-09-10 DIAGNOSIS — I69319 Unspecified symptoms and signs involving cognitive functions following cerebral infarction: Secondary | ICD-10-CM

## 2017-09-10 DIAGNOSIS — I69398 Other sequelae of cerebral infarction: Secondary | ICD-10-CM

## 2017-09-10 DIAGNOSIS — R269 Unspecified abnormalities of gait and mobility: Secondary | ICD-10-CM

## 2017-09-10 NOTE — Progress Notes (Signed)
Occupational Therapy Session Note  Patient Details  Name: Luis Wiley MRN: [id number] Date of Birth: 26-Mar-1955  Today's Date: 09/10/2017 OT Individual Time: 0700-0825 OT Individual Time Calculation (min): 85 min    Short Term Goals: Week 1:  OT Short Term Goal 1 (Week 1): STGs=LTGs due to ELOS  Skilled Therapeutic Interventions/Progress Updates:    Pt standing at bedside with NT attending upon arrival.  Pt requested to go for a walk but agreeable to taking a shower first.  Pt incontinent of bladder and unaware.  Pt completed shower while standing at supervision level.  Pt is very thorough in bathing and required more than a reasonable amount of time to complete tasks.  Pt completed dressing tasks with sit<>stand from chair. Pt engaged in functional amb in hallway and in ADL apartment for simple home mgmt tasks.  Pt is very deliberate in all movements and task initiation.  Pt easily distracted by environmental noises.  Pt returned to room and remained in w/c with belt alarm activated and breakfast placed on table adjacent to pt.  All needs within reach.   Therapy Documentation Precautions:  Precautions Precautions: Fall Precaution Comments: monitor BP Restrictions Weight Bearing Restrictions: No Pain:  Pt denies pain  See Function Navigator for Current Functional Status.   Therapy/Group: Individual Therapy  [name] 09/10/2017, 8:26 AM

## 2017-09-10 NOTE — Consult Note (Signed)
Neuropsychological Consultation   Patient:   Luis Wiley   DOB:   1955-07-08  MR Number:  [id number]  Location:  [facility] [id number] [place] [address] Dept: [phone] JSE: [phone]           Date of Service:   09/09/2017  Start Time:   1 PM End Time:   2 PM  Provider/Observer:  Ilean Skill, Psy.D.       Clinical Neuropsychologist       Billing Code/Service: [phone] 4 Units  Chief Complaint:    [name] is a 62 year old male with a history of tobacco abuse.  The patient presented on 09/02/2017 with left-sided weakness and altered mental status as well as aphasia.  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  There was no midline shift or hydrocephalus.  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  The patient has had adjustment difficulties with extended hospital stay.  Reason for Service:  Luis Wiley was referred for neuropsychological consultation due to adjustment and coping issues as well as questions about whether his mild to moderate noncompliance was due to neuropsychological/neurological reasons or just premorbid personality styles.  Below is the HPI for the current admission.  HPI:Per CT evidence of 62 year old right-handed male history of tobacco abuse. Per chart review he lives with his sister. One level home with 3 steps to entry. Independent prior to admission. Presented 09/02/2017 with left-sided weakness and altered mental status with aphasia. Blood pressure 626 systolic per EMS. 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 Patient did receive TPA. CT angiogram of head and neck showed widespread intracranial atherosclerosis without large vessel occlusion.  Echocardiogram with ejection fraction of 94% grade 1 diastolic dysfunction. Creatinine mildly elevated on admission 1.50 felt to be baseline and monitored. Neurology follow-up advise conservative care close monitoring of blood pressure. Tolerating a regular diet. Physical and occupational therapy evaluations completed with recommendations of physical medicine rehab consult. Patient was admitted for a comprehensive rehab program.  Current Status:  During the 1 hour face-to-face clinical interview with the patient today, the patient appeared to be oriented.  His cognition and information processing speed appeared to be quite slow and delayed.  The patient was very deliberate in his responding to questions but did appear to understand simple and complex questions to an adequate degree.  The patient tended to be very deliberate and not impulsive or showing inability to inhibit inappropriate responses.  However, on two occasions, the patient tried to stand up at inappropriate times, without locking wheel chair etc.  Patient was assisted back down for safety reasons.  The patient continues with some motor deficits.  He states that he just wanted to stand up and was tired of not being able to get up when ever he wanted.  Appeared aware of his motor weakness post CVA.  The patients memory and impulse control appear intact.  His standing and wanting to walk to bathroom on his own without precautions appear volitional.  The patient will need continued education as to the need for assisted walking and standing currently and risks associated with his actions.  He does appear to understand but personality styles likely just have him doing what he wants to do.    Behavioral Observation: MADDOXX BURKITT  presents as a 62 y.o.-year-old Right  African American Male who appeared his stated age. his dress was Appropriate and he was Well Groomed and his manners were Appropriate to the situation.  his participation was indicative  of Appropriate and Attentive behaviors.  There were any physical disabilities noted.  he displayed an appropriate level of cooperation and motivation.     Interactions:    Active Appropriate  Attention:   within normal limits and attention span and concentration were age appropriate  Memory:   within normal limits; recent and remote memory intact  Visuo-spatial:  not examined  Speech (Volume):  low  Speech:   normal; Slowed and deliberate  Thought Process:  Coherent and Relevant  Though Content:  WNL; not suicidal and not homicidal  Orientation:   person, place, time/date and situation  Judgment:   Poor  Planning:   Fair  Affect:    Appropriate  Mood:    Dysphoric  Insight:   Fair  Intelligence:   normal  Family Med/Psych History:  Family History  Problem Relation Age of Onset  . Hypertension Mother     Risk of Suicide/Violence: low patient denies suicidal or homicidal ideation.  Impression/DX:  Luis Wiley is a 61 year old male with a history of tobacco abuse.  The patient presented on 09/02/2017 with left-sided weakness and altered mental status as well as aphasia.  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  There was no midline shift or hydrocephalus.  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  The patient has had adjustment difficulties with extended hospital stay.  During the 1 hour face-to-face clinical interview with the patient today, the patient appeared to be oriented.  His cognition and information processing speed appeared to be quite slow and delayed.  The patient was very deliberate in his responding to questions but did appear to understand simple and complex questions to an adequate degree.  The patient tended to be very deliberate and not impulsive or showing inability to inhibit inappropriate responses.  However, on two occasions, the patient tried to stand up at inappropriate  times, without locking wheel chair etc.  Patient was assisted back down for safety reasons.  The patient continues with some motor deficits.  He states that he just wanted to stand up and was tired of not being able to get up when ever he wanted.  Appeared aware of his motor weakness post CVA.  The patients memory and impulse control appear intact.  His standing and wanting to walk to bathroom on his own without precautions appear volitional.  The patient will need continued education as to the need for assisted walking and standing currently and risks associated with his actions.  He does appear to understand but personality styles likely just have him doing what he wants to do.           Electronically Signed   _______________________ Ilean Skill, Psy.D.

## 2017-09-10 NOTE — Progress Notes (Signed)
Speech Language Pathology Daily Session Note  Patient Details  Name: ASLAN HIMES MRN: [id number] Date of Birth: 05/27/1955  Today's Date: 09/10/2017 SLP Individual Time: 0900-0930 SLP Individual Time Calculation (min): 30 min  Short Term Goals: Week 1: SLP Short Term Goal 1 (Week 1): STGs=LTGs due to short length of stay   Skilled Therapeutic Interventions: Skilled treatment session focused on cognitive goals. SLP facilitated session by providing extra time and Max A verbal cues for problem solving during a BID organization task. However, patient demonstrated increased initiation and attention throughout task. Patient continues to demonstrate decreased verbal reasoning and demonstrates difficulty answering questions with a straight forward response and utilizes long verbal explanations that don't necessarily answer the question. Patient left upright in wheelchair with alarm on and all needs within reach. Continue with current plan of care.       Function:   Cognition Comprehension Comprehension assist level: Understands basic 50 - 74% of the time/ requires cueing 25 - 49% of the time  Expression   Expression assist level: Expresses basic 50 - 74% of the time/requires cueing 25 - 49% of the time. Needs to repeat parts of sentences.  Social Interaction Social Interaction assist level: Interacts appropriately 25 - 49% of time - Needs frequent redirection.  Problem Solving Problem solving assist level: Solves basic 25 - 49% of the time - needs direction more than half the time to initiate, plan or complete simple activities  Memory Memory assist level: Recognizes or recalls 25 - 49% of the time/requires cueing 50 - 75% of the time    Pain No/Denies Pain   Therapy/Group: Individual Therapy  Roshaunda Starkey 09/10/2017, 11:04 AM

## 2017-09-10 NOTE — Progress Notes (Signed)
Sister in this afternoon.  TeleSitter called to inform that pt up walking around in room.  Sister wanted to walk pt in hallway.  Informed that must be checked off by therapy.  She requested that therapist come and do education now. She reports that is Therapist, sports and works and he is going home on Friday.  Informed that can do family ed but is scheduled. Said that her schedule is all over the place.  Therapy not available. Asked Luis Wiley to discuss with her.  Checked back and again sister was found walking pt.  Checked safety plan and no information added that teaching had been completed.  She reported that therapist is coming back.  Ben did touch base with her as she was leaving.  Teaching not done with sister/family at this time but will attempt to accommodate while her or at time of d/c. Will continue to monitor.

## 2017-09-10 NOTE — Progress Notes (Signed)
Social Work Patient ID: Luis Wiley, male   DOB: 1955-05-14, 62 y.o.   MRN: [id number] Met with sister when here to discuss team conference and his need for supervision at discharge. She reports someone is always there at her home. Pt furniture walked prior to admission she wants him to get a walker before he leaves. Discussed medication assistance also. She has some appointments Friday am will be here in afternoon to transport pt home. Work toward discharge on Friday.

## 2017-09-10 NOTE — Progress Notes (Signed)
Physical Therapy Session Note  Patient Details  Name: Luis Wiley MRN: [id number] Date of Birth: April 24, 1955  Today's Date: 09/10/2017 PT Individual Time: 1000-1057 and 1130-1157 PT Individual Time Calculation (min): 57 min and 27 min   Short Term Goals: Week 1:  PT Short Term Goal 1 (Week 1): STG = LTG due to short ELOS.  Skilled Therapeutic Interventions/Progress Updates:    Session 1: Pt seated in w/c upon PT arrival, agreeable to therapy tx and denies pain. Pt transferred to standing with supervision and ambulated to chair with CGA. Pt donned shoes with increased time and supervision. Pt ambulated from room>gym x 150 ft with CGA, pt occasionally reaches for objects/rails to steady himself. Therapist trialed use of RW this session for increased independence with mobility, pt states that he did not use one before and probably would not use it. Pt ambulated x 80 ft with RW and supervision. Pt participated in berg balance test and TUG:  Standardized Balance Assessment: Berg Balance Test;Timed Up and Go Test Berg Balance Test Sit to Stand: Able to stand without using hands and stabilize independently Standing Unsupported: Able to stand 2 minutes with supervision Sitting with Back Unsupported but Feet Supported on Floor or Stool: Able to sit safely and securely 2 minutes Stand to Sit: Sits safely with minimal use of hands Transfers: Able to transfer safely, definite need of hands Standing Unsupported with Eyes Closed: Able to stand 10 seconds with supervision Standing Ubsupported with Feet Together: Able to place feet together independently and stand for 1 minute with supervision From Standing, Reach Forward with Outstretched Arm: Can reach forward >12 cm safely (5") From Standing Position, Pick up Object from Floor: Able to pick up shoe, needs supervision From Standing Position, Turn to Look Behind Over each Shoulder: Turn sideways only but maintains balance Turn 360 Degrees: Able to turn  360 degrees safely but slowly Standing Unsupported, Alternately Place Feet on Step/Stool: Able to stand independently and complete 8 steps >20 seconds Standing Unsupported, One Foot in Front: Loses balance while stepping or standing Standing on One Leg: Tries to lift leg/unable to hold 3 seconds but remains standing independently Total Score: 38 Timed Up and Go Test TUG: Normal TUG Normal TUG (seconds): 28.5  Therapist discussed these results with the patient and educated him that based off these scores/standardized tests it is recommended that he does use a RW for gait. Pt ambulated from gym>ortho gym x 150 ft with RW and supervision, performed car transfer at height of 30 inch to simulate a truck, supervision and verbal cues for techniques/problem solving. Pt ambulated x 150 ft back to gym and then x150 ft back to room with RW and supervision. Pt left seated in w/c with needs in reach and chair alarm set.   Session 2: Pt seated in w/c upon PT arrival, agreeable to therapy tx and denies pain. Pt ambulated 2 x 100 ft this session from room<>dayroom with RW and supervision, decreased gait speed with verbal cues for increased step length. Pt transferred to nustep with RW and supervision. Pt used nustep x 8 minutes on workload 6 for global strengthening. Pt ambulated back to room and left with needs in reach, chair alarm set.   Therapy Documentation Precautions:  Precautions Precautions: Fall Precaution Comments: monitor BP Restrictions Weight Bearing Restrictions: No   See Function Navigator for Current Functional Status.   Therapy/Group: Individual Therapy  Netta Corrigan, PT, DPT 09/10/2017, 7:46 AM

## 2017-09-10 NOTE — Progress Notes (Signed)
Social Work Patient ID: Luis Wiley, male   DOB: 04/06/1955, 62 y.o.   MRN: [id number]  Met with pt and left message for sister-[name] regarding team conference goals-supervision-mod/i level and discharge 8/16.  Pt feels he can mange at home he feels the therapists worry too much and he can do it. Discussed PT recommending a rolling walker and he is not sure he will use this at home. Will await sister's return call. Applying for Medicaid today in room will need to apply for disability also.  

## 2017-09-10 NOTE — Patient Care Conference (Signed)
Inpatient RehabilitationTeam Conference and Plan of Care Update Date: 09/10/2017   Time: 11:00 AM    Patient Name: Luis Wiley      Medical Record Number: [id number]  Date of Birth: 1955/07/31 Sex: Male         Room/Bed: 4W09C/4W09C-01 Payor Info: Payor: MEDICAID PENDING / Plan: MEDICAID PENDING / Product Type: *No Product type* /    Admitting Diagnosis: CVA  Admit Date/Time:  09/05/2017  6:19 PM Admission Comments: No comment available   Primary Diagnosis:  Thalamic hemorrhage ([place]) Principal Problem: Thalamic hemorrhage [facility])  Patient Active Problem List   Diagnosis Date Noted  . Tobacco abuse 09/06/2017  . CKD (chronic kidney disease) stage 3, GFR 30-59 ml/min (HCC) 09/06/2017  . Anemia 09/06/2017  . 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 Essential hypertension   . ICH (intracerebral hemorrhage) ([place]) 09/02/2017    Expected Discharge Date: Expected Discharge Date: 09/12/17  Team Members Present: Physician leading conference: Dr. Alysia Penna Social Worker Present: Ovidio Kin, LCSW Nurse Present: Isla Pence, RN PT Present: Michaelene Song, PT OT Present: Willeen Cass, OT;Roanna Epley, COTA SLP Present: Weston Anna, SLP PPS Coordinator present : Daiva Nakayama, RN, CRRN     Current Status/Progress Goal Weekly Team Focus  Medical   Cognitive dysfunction post stroke, delayed processing, gait disorder with decreased balance  Maintain medical stability, reduce fall risk  Discharge planning   Bowel/Bladder   continent/incontinent of bowel/bladder; LBM 09/09/17  To be continent of bowel/bladder with min assist  Timed toileting q 2hrs and as needed   Swallow/Nutrition/ Hydration             ADL's   supervision with occasional min A for LB dressing tasks; extra time to initiate and complete all tasks; min verbal cues for sequencing and task initiation  supervision overall  activity tolerance, safety awareness, standing balance, discharge planning   Mobility   min  assist- CGA for all mobility, gait up to 150 ft  supervision-mod I  gait, stairs, safety, endurance, strength, d/c planning   Communication             Safety/Cognition/ Behavioral Observations  Max A  Mod A  initiation, problem solving, attention    Pain   No complain of pain  <2  Assess and treat pain q shift and as needed   Skin   No skin issues noted  Skin to be free from breakdown/infection with min assist  Assess skin q shift and as needed      *See Care Plan and progress notes for long and short-term goals.     Barriers to Discharge  Current Status/Progress Possible Resolutions Date Resolved   Physician    Decreased caregiver support;Medical stability     Progressing towards goals occasional blood pressure spikes  Continue rehab, manage blood pressure      Nursing                  PT                    OT                  SLP                SW Decreased caregiver support;Medication compliance Will be alone during the day and was not going to a MD prior to admission            Discharge Planning/Teaching Needs:  Home with  sister and brother in-law who work during the day and brother in-laws mother who can not assist him. He will need to be supervision to mod/i if possible. No option to go to [facility] uninsured      Team Discussion:  Goals supervision level due to cognitive issues. Poor po intake doesn't like the food here. Timed tolieting for continence. Slow cognitively and will need 24 hr supervision for safety. Berg 36/56. To apply for Medicaid today in room.   Revisions to Treatment Plan:  DC 8/16    Continued Need for Acute Rehabilitation Level of Care: The patient requires daily medical management by a physician with specialized training in physical medicine and rehabilitation for the following conditions: Daily direction of a multidisciplinary physical rehabilitation program to ensure safe treatment while eliciting the highest outcome that is of practical value to  the patient.: Yes Daily medical management of patient stability for increased activity during participation in an intensive rehabilitation regime.: Yes Daily analysis of laboratory values and/or radiology reports with any subsequent need for medication adjustment of medical intervention for : Neurological problems;Blood pressure problems  Jossilyn Benda, Gardiner Rhyme 09/11/2017, 8:12 AM

## 2017-09-10 NOTE — Progress Notes (Signed)
Subjective/Complaints:  No issues overnite  ROS:  Neg CP, SOB, N/V/D  Objective: Vital Signs: Blood pressure (!) (P) 147/95, pulse (P) 83, temperature (!) (P) 97.3 F (36.3 C), resp. rate (P) 19, height 6' (1.829 m), weight 71.4 kg, SpO2 (P) 100 %. No results found. Results for orders placed or performed during the hospital encounter of 09/05/17 (from the past 72 hour(s))  CBC WITH DIFFERENTIAL     Status: Abnormal   Collection Time: 09/08/17  6:13 AM  Result Value Ref Range   WBC 5.0 4.0 - 10.5 K/uL   RBC 4.57 4.22 - 5.81 MIL/uL   Hemoglobin 8.6 (L) 13.0 - 17.0 g/dL   HCT 30.9 (L) 39.0 - 52.0 %   MCV 67.6 (L) 78.0 - 100.0 fL   MCH 18.8 (L) 26.0 - 34.0 pg   MCHC 27.8 (L) 30.0 - 36.0 g/dL   RDW 18.9 (H) 11.5 - 15.5 %   Platelets 319 150 - 400 K/uL   Neutrophils Relative % 55 %   Lymphocytes Relative 29 %   Monocytes Relative 11 %   Eosinophils Relative 4 %   Basophils Relative 1 %   Neutro Abs 2.6 1.7 - 7.7 K/uL   Lymphs Abs 1.5 0.7 - 4.0 K/uL   Monocytes Absolute 0.6 0.1 - 1.0 K/uL   Eosinophils Absolute 0.2 0.0 - 0.7 K/uL   Basophils Absolute 0.1 0.0 - 0.1 K/uL   RBC Morphology ELLIPTOCYTES     Comment: Performed at [facility], [address] [place], [place] [address]  Comprehensive metabolic panel     Status: Abnormal   Collection Time: 09/08/17  6:13 AM  Result Value Ref Range   Sodium 138 135 - 145 mmol/L   Potassium 3.5 3.5 - 5.1 mmol/L   Chloride 106 98 - 111 mmol/L   CO2 23 22 - 32 mmol/L   Glucose, Bld 93 70 - 99 mg/dL   BUN 13 8 - 23 mg/dL   Creatinine, Ser 1.47 (H) 0.61 - 1.24 mg/dL   Calcium 8.8 (L) 8.9 - 10.3 mg/dL   Total Protein 6.8 6.5 - 8.1 g/dL   Albumin 3.4 (L) 3.5 - 5.0 g/dL   AST 15 15 - 41 U/L   ALT 16 0 - 44 U/L   Alkaline Phosphatase 75 38 - 126 U/L   Total Bilirubin 0.6 0.3 - 1.2 mg/dL   GFR calc non Af Amer 49 (L) >60 mL/min   GFR calc Af Amer 57 (L) >60 mL/min    Comment: (NOTE) The eGFR has been calculated using the CKD EPI  equation. This calculation has not been validated in all clinical situations. eGFR's persistently <60 mL/min signify possible Chronic Kidney Disease.    Anion gap 9 5 - 15    Comment: Performed at [facility] [address], [place], [place] [address]  PSA     Status: None   Collection Time: 09/08/17  9:23 AM  Result Value Ref Range   Prostatic Specific Antigen 1.00 0.00 - 4.00 ng/mL    Comment: (NOTE) While PSA levels of <=4.0 ng/ml are reported as reference range, some men with levels below 4.0 ng/ml can have prostate cancer and many men with PSA above 4.0 ng/ml do not have prostate cancer.  Other tests such as free PSA, age specific reference ranges, PSA velocity and PSA doubling time may be helpful especially in men less than 68 years old. Performed at [facility], [place] [address],  [place], [place] [address]   Iron and TIBC     Status: Abnormal   Collection Time: 09/08/17  9:23 AM  Result Value Ref Range   Iron 23 (L) 45 - 182 ug/dL   TIBC 430 250 - 450 ug/dL   Saturation Ratios 5 (L) 17.9 - 39.5 %   UIBC 407 ug/dL    Comment: Performed at [facility], [place] [address] [place], [place] [address]     HEENT: normal Cardio: RRR and no murmur Resp: CTA B/L and unlabored GI: BS positive and NT, ND Extremity:  No Edema Skin:   Intact Neuro: Confused, Flat, Abnormal Motor 4/5 in BUE and BLE, Abnormal FMC Ataxic/ dec FMC and Other Slowed responses, oriented to person, hosp not [facility] or to time Musc/Skel:  Normal and Other no pain with UE or LE ROM Gen NAD   Assessment/Plan: 1. 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 which require 3+ hours per day of interdisciplinary therapy in a comprehensive inpatient rehab setting. Physiatrist is providing close team supervision and 24 hour management of active medical problems listed below. Physiatrist and rehab team continue to assess barriers to discharge/monitor patient progress toward functional and  medical goals. FIM: Function - Bathing Position: Shower Body parts bathed by patient: Right arm, Left arm, Chest, Abdomen, Front perineal area, Buttocks, Right upper leg, Left upper leg, Right lower leg, Left lower leg Body parts bathed by helper: Back Bathing not applicable: Back Assist Level: Supervision or verbal cues  Function- Upper Body Dressing/Undressing What is the patient wearing?: Pull over shirt/dress Pull over shirt/dress - Perfomed by patient: Thread/unthread right sleeve, Thread/unthread left sleeve, Put head through opening, Pull shirt over trunk Assist Level: Supervision or verbal cues Function - Lower Body Dressing/Undressing What is the patient wearing?: Underwear, Pants, Non-skid slipper socks Position: Other (comment)(chair) Underwear - Performed by patient: Thread/unthread right underwear leg, Thread/unthread left underwear leg, Pull underwear up/down Pants- Performed by patient: Thread/unthread left pants leg, Pull pants up/down Pants- Performed by helper: Thread/unthread right pants leg, Fasten/unfasten pants Non-skid slipper socks- Performed by patient: Don/doff right sock, Don/doff left sock Socks - Performed by patient: Don/doff right sock, Don/doff left sock Shoes - Performed by patient: Don/doff right shoe, Don/doff left shoe, Fasten right, Fasten left Assist for footwear: Supervision/touching assist Assist for lower body dressing: Supervision or verbal cues  Function - Toileting Toileting activity did not occur: No continent bowel/bladder event Toileting steps completed by helper: Adjust clothing prior to toileting, Performs perineal hygiene, Adjust clothing after toileting Assist level: Touching or steadying assistance (Pt.75%)  Function - Air cabin crew transfer assistive device: Grab bar Assist level to toilet: Touching or steadying assistance (Pt > 75%) Assist level from toilet: Touching or steadying assistance (Pt > 75%)  Function -  Chair/bed transfer Chair/bed transfer method: Ambulatory Chair/bed transfer assist level: Touching or steadying assistance (Pt > 75%) Chair/bed transfer assistive device: Armrests Chair/bed transfer details: Verbal cues for precautions/safety  Function - Locomotion: Wheelchair Will patient use wheelchair at discharge?: No Wheelchair activity did not occur: Safety/medical concerns Wheel 50 feet with 2 turns activity did not occur: Safety/medical concerns Wheel 150 feet activity did not occur: Safety/medical concerns Function - Locomotion: Ambulation Assistive device: No device Max distance: 150 ft Assist level: Touching or steadying assistance (Pt > 75%) Assist level: Touching or steadying assistance (Pt > 75%) Assist level: Touching or steadying assistance (Pt > 75%) Assist level: Touching or steadying assistance (Pt > 75%) Assist level: Touching or steadying  assistance (Pt > 75%)  Function - Comprehension Comprehension: Auditory Comprehension assist level: Understands basic 50 - 74% of the time/ requires cueing 25 - 49% of the time  Function - Expression Expression: Verbal Expression assist level: Expresses basic 50 - 74% of the time/requires cueing 25 - 49% of the time. Needs to repeat parts of sentences.  Function - Social Interaction Social Interaction assist level: Interacts appropriately 25 - 49% of time - Needs frequent redirection.  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 Formulized individual treatment plan based on medical history, underlying problem and comorbidities. 2. DVT Prophylaxis/Anticoagulation: SCDs. Monitor for any signs of DVT 3. Pain Management:Tylenol as needed 4. Mood:Provide emotional support 5. Neuropsych: This patientiscapable of making decisions on hisown behalf. 6. Skin/Wound Care:Routine skin checks 7. Fluids/Electrolytes/Nutrition:Routine in and outs with follow-up chemistries 8.Hypertension. Monitor with increased mobility. VS per routine Vitals:   09/09/17 1950 09/10/17 0536  BP: (!) 150/96 (!) (P) 147/95  Pulse: 67 (P) 83  Resp: 20 (P) 19  Temp: 98.8 F (37.1 C) (!) (P) 97.3 F (36.3 C)  SpO2: 100% (P) 100%  sys BP goal <160 9.Tobacco abuse. 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 Follow-up chemistries, stable values 11. Microcytic anemia- low  Fe and saturation  and guaic pnd , add Fe supplement 12.  Urinary freq was having some inc prior to CVA with freq urination normal PSA, PVRs LOS (Days) 5 A FACE TO FACE EVALUATION WAS PERFORMED  Charlett Blake 09/10/2017, 11:39 AM

## 2017-09-10 NOTE — Progress Notes (Signed)
Subjective/Complaints:  No issues overnite  ROS:  Neg CP, SOB, N/V/D  Objective: Vital Signs: Blood pressure (!) (P) 147/95, pulse (P) 83, temperature (!) (P) 97.3 F (36.3 C), resp. rate (P) 19, height 6' (1.829 m), weight 71.4 kg, SpO2 (P) 100 %. No results found. Results for orders placed or performed during the hospital encounter of 09/05/17 (from the past 72 hour(s))  CBC WITH DIFFERENTIAL     Status: Abnormal   Collection Time: 09/08/17  6:13 AM  Result Value Ref Range   WBC 5.0 4.0 - 10.5 K/uL   RBC 4.57 4.22 - 5.81 MIL/uL   Hemoglobin 8.6 (L) 13.0 - 17.0 g/dL   HCT 30.9 (L) 39.0 - 52.0 %   MCV 67.6 (L) 78.0 - 100.0 fL   MCH 18.8 (L) 26.0 - 34.0 pg   MCHC 27.8 (L) 30.0 - 36.0 g/dL   RDW 18.9 (H) 11.5 - 15.5 %   Platelets 319 150 - 400 K/uL   Neutrophils Relative % 55 %   Lymphocytes Relative 29 %   Monocytes Relative 11 %   Eosinophils Relative 4 %   Basophils Relative 1 %   Neutro Abs 2.6 1.7 - 7.7 K/uL   Lymphs Abs 1.5 0.7 - 4.0 K/uL   Monocytes Absolute 0.6 0.1 - 1.0 K/uL   Eosinophils Absolute 0.2 0.0 - 0.7 K/uL   Basophils Absolute 0.1 0.0 - 0.1 K/uL   RBC Morphology ELLIPTOCYTES     Comment: Performed at [facility], [address] [place], [place] [address]  Comprehensive metabolic panel     Status: Abnormal   Collection Time: 09/08/17  6:13 AM  Result Value Ref Range   Sodium 138 135 - 145 mmol/L   Potassium 3.5 3.5 - 5.1 mmol/L   Chloride 106 98 - 111 mmol/L   CO2 23 22 - 32 mmol/L   Glucose, Bld 93 70 - 99 mg/dL   BUN 13 8 - 23 mg/dL   Creatinine, Ser 1.47 (H) 0.61 - 1.24 mg/dL   Calcium 8.8 (L) 8.9 - 10.3 mg/dL   Total Protein 6.8 6.5 - 8.1 g/dL   Albumin 3.4 (L) 3.5 - 5.0 g/dL   AST 15 15 - 41 U/L   ALT 16 0 - 44 U/L   Alkaline Phosphatase 75 38 - 126 U/L   Total Bilirubin 0.6 0.3 - 1.2 mg/dL   GFR calc non Af Amer 49 (L) >60 mL/min   GFR calc Af Amer 57 (L) >60 mL/min    Comment: (NOTE) The eGFR has been calculated using the CKD EPI  equation. This calculation has not been validated in all clinical situations. eGFR's persistently <60 mL/min signify possible Chronic Kidney Disease.    Anion gap 9 5 - 15    Comment: Performed at [facility] [address] [place], [place] [address]  PSA     Status: None   Collection Time: 09/08/17  9:23 AM  Result Value Ref Range   Prostatic Specific Antigen 1.00 0.00 - 4.00 ng/mL    Comment: (NOTE) While PSA levels of <=4.0 ng/ml are reported as reference range, some men with levels below 4.0 ng/ml can have prostate cancer and many men with PSA above 4.0 ng/ml do not have prostate cancer.  Other tests such as free PSA, age specific reference ranges, PSA velocity and PSA doubling time may be helpful especially in men less than 40 years old. Performed at [facility], [place] [address]  [place], [place] [address]   Iron and TIBC     Status: Abnormal   Collection Time: 09/08/17  9:23 AM  Result Value Ref Range   Iron 23 (L) 45 - 182 ug/dL   TIBC 430 250 - 450 ug/dL   Saturation Ratios 5 (L) 17.9 - 39.5 %   UIBC 407 ug/dL    Comment: Performed at [facility], [place] [address] [place], [place] [address]     HEENT: normal Cardio: RRR and no murmur Resp: CTA B/L and unlabored GI: BS positive and NT, ND Extremity:  No Edema Skin:   Intact Neuro: Confused, Flat, Abnormal Motor 4/5 in BUE and BLE, Abnormal FMC Ataxic/ dec FMC and Other Slowed responses, oriented to person, hosp not [facility] or to time Musc/Skel:  Normal and Other no pain with UE or LE ROM Gen NAD   Assessment/Plan: 1. 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 which require 3+ hours per day of interdisciplinary therapy in a comprehensive inpatient rehab setting. Physiatrist is providing close team supervision and 24 hour management of active medical problems listed below. Physiatrist and rehab team continue to assess barriers to discharge/monitor patient progress toward functional and  medical goals. FIM: Function - Bathing Position: Shower Body parts bathed by patient: Right arm, Left arm, Chest, Abdomen, Front perineal area, Buttocks, Right upper leg, Left upper leg, Right lower leg, Left lower leg Body parts bathed by helper: Back Bathing not applicable: Back Assist Level: Supervision or verbal cues  Function- Upper Body Dressing/Undressing What is the patient wearing?: Pull over shirt/dress Pull over shirt/dress - Perfomed by patient: Thread/unthread right sleeve, Thread/unthread left sleeve, Put head through opening, Pull shirt over trunk Assist Level: Supervision or verbal cues Function - Lower Body Dressing/Undressing What is the patient wearing?: Underwear, Pants, Non-skid slipper socks Position: Other (comment)(chair) Underwear - Performed by patient: Thread/unthread right underwear leg, Thread/unthread left underwear leg, Pull underwear up/down Pants- Performed by patient: Thread/unthread left pants leg, Pull pants up/down Pants- Performed by helper: Thread/unthread right pants leg, Fasten/unfasten pants Non-skid slipper socks- Performed by patient: Don/doff right sock, Don/doff left sock Socks - Performed by patient: Don/doff right sock, Don/doff left sock Shoes - Performed by patient: Don/doff right shoe, Don/doff left shoe, Fasten right, Fasten left Assist for footwear: Supervision/touching assist Assist for lower body dressing: Supervision or verbal cues  Function - Toileting Toileting activity did not occur: No continent bowel/bladder event Toileting steps completed by helper: Adjust clothing prior to toileting, Performs perineal hygiene, Adjust clothing after toileting Assist level: Touching or steadying assistance (Pt.75%)  Function - Air cabin crew transfer assistive device: Grab bar Assist level to toilet: Touching or steadying assistance (Pt > 75%) Assist level from toilet: Touching or steadying assistance (Pt > 75%)  Function -  Chair/bed transfer Chair/bed transfer method: Ambulatory Chair/bed transfer assist level: Touching or steadying assistance (Pt > 75%) Chair/bed transfer assistive device: Armrests Chair/bed transfer details: Verbal cues for precautions/safety  Function - Locomotion: Wheelchair Will patient use wheelchair at discharge?: No Wheelchair activity did not occur: Safety/medical concerns Wheel 50 feet with 2 turns activity did not occur: Safety/medical concerns Wheel 150 feet activity did not occur: Safety/medical concerns Function - Locomotion: Ambulation Assistive device: No device Max distance: 150 ft Assist level: Touching or steadying assistance (Pt > 75%) Assist level: Touching or steadying assistance (Pt > 75%) Assist level: Touching or steadying assistance (Pt > 75%) Assist level: Touching or steadying assistance (Pt > 75%) Assist level: Touching or steadying  assistance (Pt > 75%)  Function - Comprehension Comprehension: Auditory Comprehension assist level: Understands basic 50 - 74% of the time/ requires cueing 25 - 49% of the time  Function - Expression Expression: Verbal Expression assist level: Expresses basic 50 - 74% of the time/requires cueing 25 - 49% of the time. Needs to repeat parts of sentences.  Function - Social Interaction Social Interaction assist level: Interacts appropriately 25 - 49% of time - Needs frequent redirection.  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 Formulized individual treatment plan based on medical history, underlying problem and comorbidities. 2. DVT Prophylaxis/Anticoagulation: SCDs. Monitor for any signs of DVT 3. Pain Management:Tylenol as needed 4. Mood:Provide emotional support 5. Neuropsych: This patientiscapable of making decisions on hisown behalf. 6. Skin/Wound Care:Routine skin checks 7. Fluids/Electrolytes/Nutrition:Routine in and outs with follow-up chemistries 8.Hypertension. Monitor with increased mobility. VS per routine Vitals:   09/09/17 1950 09/10/17 0536  BP: (!) 150/96 (!) (P) 147/95  Pulse: 67 (P) 83  Resp: 20 (P) 19  Temp: 98.8 F (37.1 C) (!) (P) 97.3 F (36.3 C)  SpO2: 100% (P) 100%  sys BP goal <160 9.Tobacco abuse. 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 Follow-up chemistries, stable values 11. Microcytic anemia- low  Fe and saturation  and guaic pnd , add Fe supplement 12.  Urinary freq was having some inc prior to CVA with freq urination normal PSA, PVRs LOS (Days) 5 A FACE TO FACE EVALUATION WAS PERFORMED  Charlett Blake 09/10/2017, 11:34 AM

## 2017-09-10 NOTE — Progress Notes (Signed)
Call to [facility] A. PA on call, report pt bp of 179/104 via dynamap, 168/108 via manual.  HR 64 and regular, pt denies ha, denies pain, no other c/o, sitting in chair watching TV, restful.  Per Linna Hoff no new orders at this time.

## 2017-09-11 ENCOUNTER — Inpatient Hospital Stay (HOSPITAL_COMMUNITY): Payer: Self-pay

## 2017-09-11 ENCOUNTER — Inpatient Hospital Stay (HOSPITAL_COMMUNITY): Payer: Self-pay | Admitting: Physical Therapy

## 2017-09-11 ENCOUNTER — Inpatient Hospital Stay (HOSPITAL_COMMUNITY): Payer: Self-pay | Admitting: Speech Pathology

## 2017-09-11 MED ORDER — PANTOPRAZOLE SODIUM 40 MG PO TBEC: 40.0000 mg | DELAYED_RELEASE_TABLET | Freq: Every day | ORAL | 0 refills | Status: AC

## 2017-09-11 MED ORDER — LISINOPRIL 40 MG PO TABS: 40.0000 mg | ORAL_TABLET | Freq: Every day | ORAL | 1 refills | Status: AC

## 2017-09-11 MED ORDER — ACETAMINOPHEN 325 MG PO TABS: 650.0000 mg | ORAL_TABLET | ORAL | Status: AC | PRN

## 2017-09-11 MED ORDER — ATORVASTATIN CALCIUM 20 MG PO TABS: 20.0000 mg | ORAL_TABLET | Freq: Every day | ORAL | 1 refills | Status: AC

## 2017-09-11 MED ORDER — SENNOSIDES-DOCUSATE SODIUM 8.6-50 MG PO TABS: 1.0000 | ORAL_TABLET | Freq: Two times a day (BID) | ORAL | Status: AC

## 2017-09-11 MED ORDER — NICOTINE 14 MG/24HR TD PT24: MEDICATED_PATCH | TRANSDERMAL | 0 refills | Status: AC

## 2017-09-11 NOTE — Progress Notes (Signed)
Social Work Patient ID: Luis Wiley, male   DOB: 1955/11/22, 62 y.o.   MRN: [id number]  Met with pt along with Courtney-Speech to inform again of his discharge date tomorrow and that his sister will be coming after lunch and her appointments to transport him home. Discussed his memory issues and this is the reason he will need supervision at home. He replied: " Oh yeah."

## 2017-09-11 NOTE — Progress Notes (Signed)
Occupational Therapy Discharge Summary  Patient Details  Name: TAVARES LEVINSON MRN: [id number] Date of Birth: [birth year]  Patient has met 10 of 10 long term goals due to improved activity tolerance, improved balance, postural control, ability to compensate for deficits, functional use of  LEFT upper and LEFT lower extremity, improved attention, improved awareness and improved coordination. Pt made steady progress with BADLs during this admission.  Pt continues to require mod/max verbal cues to initiate tasks and sequence during bathing tasks.  Pt requires max verbal cues for orientation.  Pt requires more than a reasonable amount of time to initiate and complete tasks and is frequently distracted and requires verbal cues to redirect.  Recommend 24 hour supervision at discharge.  Patient to discharge at overall Supervision level.  Patient's care partner is independent to provide the necessary cognitive assistance at discharge.      Recommendation:  No f/u OT services recommended.  Equipment: No equipment provided Pt owns tub transfer bench (per pt report)  Reasons for discharge: treatment goals met  Patient/family agrees with progress made and goals achieved: Yes  OT Discharge Vision Baseline Vision/History: Wears glasses Wears Glasses: At all times Patient Visual Report: No change from baseline Vision Assessment?: No apparent visual deficits Perception  Perception: Impaired(decreased attention to L side) Praxis Praxis: Impaired Praxis Impairment Details: Initiation;Motor planning;Perseveration;Ideation;Ideomotor Praxis-Other Comments: Requires mod-max verbal cues for sequencing, intiation, and motor planning Cognition Overall Cognitive Status: Impaired/Different from baseline Arousal/Alertness: Awake/alert Orientation Level: Oriented to person;Oriented to situation;Oriented to place Sustained Attention: Impaired Divided Attention: Impaired Memory: Impaired Memory Impairment:  Decreased recall of new information;Decreased short term memory Awareness: Impaired Executive Function: Initiating;Sequencing Sequencing: Impaired Initiating: Impaired Safety/Judgment: Impaired Comments: decreased awareness of deficits and decreased safety awareness Sensation Sensation Light Touch: Appears Intact Coordination Gross Motor Movements are Fluid and Coordinated: No Fine Motor Movements are Fluid and Coordinated: No Coordination and Movement Description: Increased difficulty w/ buttoning pants and managing LE garments w/ pericare, requires increased time and multiple attempts  Heel Shin Test: impaired L>R Motor  Motor Motor: Hemiplegia Motor - Discharge Observations: mild L hemi, generalized weakness Mobility  Bed Mobility Bed Mobility: Supine to Sit;Sit to Supine;Rolling Left;Rolling Right Rolling Right: Supervision/verbal cueing Rolling Left: Supervision/Verbal cueing Supine to Sit: Supervision/Verbal cueing Sit to Supine: Supervision/Verbal cueing Transfers Sit to Stand: Supervision/Verbal cueing Stand to Sit: Supervision/Verbal cueing  Trunk/Postural Assessment  Cervical Assessment Cervical Assessment: Exceptions to WFL(forward head, rounded shoulder posture) Thoracic Assessment Thoracic Assessment: Within Functional Limits Lumbar Assessment Lumbar Assessment: Within Functional Limits Postural Control Postural Control: Deficits on evaluation(delayed)  Balance Balance Balance Assessed: Yes Dynamic Standing Balance Dynamic Standing - Balance Support: No upper extremity supported;During functional activity Dynamic Standing - Level of Assistance: 5: Stand by assistance Extremity/Trunk Assessment       See Function Navigator for Current Functional Status.  Leotis Shames [facility] 09/11/2017, 12:04 PM

## 2017-09-11 NOTE — Progress Notes (Signed)
Occupational Therapy Session Note  Patient Details  Name: Luis Wiley MRN: [id number] Date of Birth: 11-30-1955  Today's Date: 09/11/2017 OT Individual Time: 1000-1055 OT Individual Time Calculation (min): 55 min    Short Term Goals: Week 1:  OT Short Term Goal 1 (Week 1): STGs=LTGs due to ELOS  Skilled Therapeutic Interventions/Progress Updates:    OT intervention with focus on BADL retraining, functional amb with RW, standing balance, and safety awareness to increase independence with BADLs. Pt requires min verbal cues for task initiation and sequencing during bathing tasks.  Pt completes shower while standing but owns tub transfer bench at home.  Pt practiced tub bench transfers but question if pt will utilize at home.  Pt does not require physical assistance to complete tasks and uses RW in a safe manner.  Recommend 24 hour supervision at discharge. Pt amb in hallway and engaged in pipe tree exercise with min verbal cues after correct components selected. Pt required max verbal cues to locate room upon return.  Pt remained in w/c with all needs within reach and belt alarm activated.   Therapy Documentation Precautions:  Precautions Precautions: Fall Precaution Comments: monitor BP Restrictions Weight Bearing Restrictions: No  Pain: Pain Assessment Pain Scale: 0-10 Pain Score: 0-No pain  See Function Navigator for Current Functional Status.   Therapy/Group: Individual Therapy  [name] 09/11/2017, 11:59 AM

## 2017-09-11 NOTE — Discharge Instructions (Signed)
Inpatient Rehab Discharge Instructions  Luis Wiley Discharge date and time: No discharge date for patient encounter.   Activities/Precautions/ Functional Status: Activity: activity as tolerated Diet: regular diet Wound Care: none needed Functional status:  ___ No restrictions     ___ Walk up steps independently ___ 24/7 supervision/assistance   ___ Walk up steps with assistance ___ Intermittent supervision/assistance  ___ Bathe/dress independently ___ Walk with walker     _x__ Bathe/dress with assistance ___ Walk Independently    ___ Shower independently ___ Walk with assistance    ___ Shower with assistance ___ No alcohol     ___ Return to work/school ________  Special Instructions:  No driving smoking or alcohol   No aspirin products or Goody powders  COMMUNITY REFERRALS UPON DISCHARGE:    Home Health:   PT, SP, SW    Agency:[facility]   Date of last service:09/12/2017  Medical Equipment/Items [place]   [phone]  [place] AND DISABILITY  GENERAL COMMUNITY RESOURCES FOR PATIENT/FAMILY: Support Groups: [facility] SUPPORT GROUP EVERY SECOND Thursday @ 6:00-7:00 ON THE REHAB UNIT QUESTIONS CONTACT [phone]  No drivingInpatient Rehab Discharge Instructions  Luis Wiley Discharge date and time: No discharge date for patient encounter.   Activities/Precautions/ Functional Status: Activity:  Diet:  Wound Care:  Functional status:  ___ No restrictions     ___ Walk up steps independently ___ 24/7 supervision/assistance   ___ Walk up steps with assistance ___ Intermittent supervision/assistance  ___ Bathe/dress independently ___ Walk with walker     ___ Bathe/dress with assistance ___ Walk Independently    ___ Shower independently ___ Walk with assistance    ___ Shower with assistance ___ No alcohol     ___ Return to work/school ________  Special Instructions:    My  questions have been answered and I understand these instructions. I will adhere to these goals and the provided educational materials after my discharge from the hospital.  Patient/Caregiver Signature _______________________________ Date __________  Clinician Signature _______________________________________ Date __________  Please bring this form and your medication list with you to all your follow-up doctor's appointments.   My questions have been answered and I understand these instructions. I will adhere to these goals and the provided educational materials after my discharge from the hospital.  Patient/Caregiver Signature _______________________________ Date __________  Clinician Signature _______________________________________ Date __________  Please bring this form and your medication list with you to all your follow-up doctor's appointments.

## 2017-09-11 NOTE — Progress Notes (Signed)
Subjective/Complaints:  Discussed with SW pt has PCP f/u on Tuesday.  Pt does not remember this, Pt's sister will pick him up today  ROS:  Neg CP, SOB, N/V/D  Objective: Vital Signs: Blood pressure (!) 164/99, pulse (!) 54, temperature 98.4 F (36.9 C), resp. rate (!) 21, height 6' (1.829 m), weight 71.4 kg, SpO2 100 %. No results found. No results found for this or any previous visit (from the past 72 hour(s)).   HEENT: normal Cardio: RRR and no murmur Resp: CTA B/L and unlabored GI: BS positive and NT, ND Extremity:  No Edema Skin:   Intact Neuro: Confused, Flat, Abnormal Motor 4/5 in BUE and BLE, Abnormal FMC Ataxic/ dec FMC and Other Slowed responses, oriented to person, hosp not [facility] or to time Musc/Skel:  Normal and Other no pain with UE or LE ROM Gen NAD   Assessment/Plan: 1. 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  Stable for D/C today F/u PCP in 4 days F/u PM&R 2 weeks See D/C summary See D/C instructions FIM: Function - Bathing Position: Shower Body parts bathed by patient: Right arm, Left arm, Chest, Abdomen, Front perineal area, Buttocks, Right upper leg, Left upper leg, Right lower leg, Left lower leg Body parts bathed by helper: Back Bathing not applicable: Back Assist Level: Supervision or verbal cues  Function- Upper Body Dressing/Undressing What is the patient wearing?: Pull over shirt/dress Pull over shirt/dress - Perfomed by patient: Thread/unthread right sleeve, Thread/unthread left sleeve, Put head through opening, Pull shirt over trunk Assist Level: Supervision or verbal cues Function - Lower Body Dressing/Undressing What is the patient wearing?: Underwear, Pants, Non-skid slipper socks Position: Other (comment)(chair) Underwear - Performed by patient: Thread/unthread right underwear leg, Thread/unthread left underwear leg, Pull underwear up/down Pants- Performed by patient: Thread/unthread left pants leg, Pull pants up/down Pants-  Performed by helper: Thread/unthread right pants leg, Fasten/unfasten pants Non-skid slipper socks- Performed by patient: Don/doff right sock, Don/doff left sock Socks - Performed by patient: Don/doff right sock, Don/doff left sock Shoes - Performed by patient: Don/doff right shoe, Don/doff left shoe, Fasten right, Fasten left Assist for footwear: Supervision/touching assist Assist for lower body dressing: Supervision or verbal cues  Function - Toileting Toileting activity did not occur: No continent bowel/bladder event Toileting steps completed by helper: Adjust clothing prior to toileting, Performs perineal hygiene, Adjust clothing after toileting Toileting Assistive Devices: Grab bar or rail Assist level: Touching or steadying assistance (Pt.75%)  Function - Air cabin crew transfer assistive device: Grab bar Assist level to toilet: Touching or steadying assistance (Pt > 75%) Assist level from toilet: Touching or steadying assistance (Pt > 75%)  Function - Chair/bed transfer Chair/bed transfer method: Ambulatory, Stand pivot Chair/bed transfer assist level: Supervision or verbal cues Chair/bed transfer assistive device: Armrests, Walker Chair/bed transfer details: Verbal cues for precautions/safety  Function - Locomotion: Wheelchair Will patient use wheelchair at discharge?: No(pt is primary ambulator) Wheelchair activity did not occur: Safety/medical concerns Wheel 50 feet with 2 turns activity did not occur: Safety/medical concerns Wheel 150 feet activity did not occur: Safety/medical concerns Function - Locomotion: Ambulation Assistive device: Walker-rolling Max distance: 150 ft Assist level: Supervision or verbal cues Assist level: Supervision or verbal cues Assist level: Supervision or verbal cues Assist level: Supervision or verbal cues Assist level: Touching or steadying assistance (Pt > 75%)  Function - Comprehension Comprehension: Auditory Comprehension  assist level: Understands basic 50 - 74% of the time/ requires cueing 25 - 49% of the time  Function -  Expression Expression: Verbal Expression assist level: Expresses basic 50 - 74% of the time/requires cueing 25 - 49% of the time. Needs to repeat parts of sentences.  Function - Social Interaction Social Interaction assist level: Interacts appropriately 25 - 49% of time - Needs frequent redirection.  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 DVT Prophylaxis/Anticoagulation: SCDs. Monitor for any signs of DVT 3. Pain Management:Tylenol as needed 4. Mood:Provide emotional support 5. Neuropsych: This patientiscapable of making decisions on hisown behalf. 6. Skin/Wound Care:Routine skin checks 7. Fluids/Electrolytes/Nutrition:Routine in and outs with follow-up chemistries 8.Hypertension. Monitor with increased mobility. VS per routine Vitals:   09/11/17 2200 09/12/17 0503  BP: (!) 142/90 (!) 164/99  Pulse:  (!) 54  Resp:  (!) 21  Temp:    SpO2:  100%  Cont Zestril with OP follow up , elevated over goal of 160 today.  Recheck 9.Tobacco abuse. 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 Follow-up chemistries, stable values 11. Microcytic anemia- low  Fe and saturation  and guaic pnd , add Fe supplement 12.  Urinary freq was having some inc prior to CVA with freq urination normal PSA, PVRs LOS (Days) 7 A FACE TO FACE EVALUATION WAS PERFORMED  Charlett Blake 09/12/2017, 8:45 AM

## 2017-09-11 NOTE — Progress Notes (Signed)
Physical Therapy Discharge Summary  Patient Details  Name: Luis Wiley MRN: [id number] Date of Birth: 1955/08/06  Today's Date: 09/11/2017 PT Individual Time: 0800-0857 AND 1500-1525 PT Individual Time Calculation (min): 57 min AND 25 min   Session 1:  Pt in supine and agreeable to therapy, denies pain. Session focused on functional mobility including toileting, gait, stairs, car transfer, and bed mobility. All mobility performed at supervision level w/ mod-max verbal cues for sequencing, technique, and initiation throughout session. He performs all mobility at very slow speed, unable to maintain faster speed for more than a few steps before returning to baseline speed. Returned to room and ended session in w/c, call bell within reach and all needs met.   Session 2:  Pt in w/c and agreeable to therapy, denies pain. Session focused on endurance and NMR. Ambulated around unit w/ RW, >150', w/ supervision for endurance training w/ gait. Mod verbal cues for safety awareness, especially when in distracting and busy environments. Performed NuStep 5 min @ level 3 for LE strengthening and reciprocal movement pattern. Verbal cues to attend to task. Returned to room and ended session in w/c, call bell within reach and all needs met.   Patient has met 10 of 10 long term goals due to improved activity tolerance, improved balance, improved postural control, increased strength, ability to compensate for deficits and improved coordination.  Patient to discharge at an ambulatory level Supervision.   Patient's care partner has not been present for family education regarding providing supervision level assist despite communication to her of therapy times.   Reasons goals not met: n/a  Recommendation:  Patient will benefit from ongoing skilled PT services in home health setting to continue to advance safe functional mobility, address ongoing impairments in functional balance, sequencing of functional tasks,  endurance, and LE strengthening, and minimize fall risk.  Equipment: RW  Reasons for discharge: treatment goals met and discharge from hospital  Patient/family agrees with progress made and goals achieved: Yes  PT Discharge Precautions/Restrictions Precautions Precautions: Fall Restrictions Weight Bearing Restrictions: No Pain Pain Assessment Pain Scale: 0-10 Pain Score: 0-No pain Vision/Perception  Perception Perception: Impaired(decreased attention to L side) Praxis Praxis: Impaired Praxis Impairment Details: Initiation;Motor planning;Perseveration;Ideation;Ideomotor Praxis-Other Comments: Requires mod-max verbal cues for sequencing, intiation, and motor planning  Cognition Overall Cognitive Status: Impaired/Different from baseline Arousal/Alertness: Awake/alert Orientation Level: Oriented to person;Oriented to situation;Oriented to place Sustained Attention: Impaired Divided Attention: Impaired Memory: Impaired Memory Impairment: Decreased recall of new information;Decreased short term memory Awareness: Impaired Executive Function: Initiating;Sequencing Sequencing: Impaired Initiating: Impaired Safety/Judgment: Impaired Comments: decreased awareness of deficits and decreased safety awareness Sensation Sensation Light Touch: Appears Intact Coordination Gross Motor Movements are Fluid and Coordinated: No Fine Motor Movements are Fluid and Coordinated: No Coordination and Movement Description: Increased difficulty w/ buttoning pants and managing LE garments w/ pericare, requires increased time and multiple attempts  Heel Shin Test: impaired L>R Motor  Motor Motor: Hemiplegia Motor - Discharge Observations: mild L hemi, generalized weakness  Mobility Bed Mobility Bed Mobility: Supine to Sit;Sit to Supine;Rolling Left;Rolling Right Rolling Right: Supervision/verbal cueing Rolling Left: Supervision/Verbal cueing Supine to Sit: Supervision/Verbal cueing Sit to  Supine: Supervision/Verbal cueing Transfers Transfers: Stand to Sit;Sit to Stand;Stand Pivot Transfers Sit to Stand: Supervision/Verbal cueing Stand to Sit: Supervision/Verbal cueing Stand Pivot Transfers: Supervision/Verbal cueing Transfer (Assistive device): None Locomotion  Gait Ambulation: Yes Gait Assistance: Supervision/Verbal cueing Gait Distance (Feet): 150 Feet Assistive device: Rolling walker Gait Assistance Details: Verbal cues for gait pattern Gait  Gait: Yes Gait Pattern: Impaired Gait Pattern: Decreased step length - left;Shuffle;Decreased trunk rotation;Poor foot clearance - left Gait velocity: decreased Stairs / Additional Locomotion Stairs: Yes Stairs Assistance: Supervision/Verbal cueing Stair Management Technique: One rail Left Number of Stairs: 4 Height of Stairs: 6 Wheelchair Mobility 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, forensic)  Trunk/Postural Assessment  Cervical Assessment Cervical Assessment: Exceptions to WFL(forward head, rounded shoulder posture) Thoracic Assessment Thoracic Assessment: Within Functional Limits Lumbar Assessment Lumbar Assessment: Within Functional Limits Postural Control Postural Control: Deficits on evaluation(delayed)  Balance Balance Balance Assessed: Yes Dynamic Standing Balance Dynamic Standing - Balance Support: No upper extremity supported;During functional activity Dynamic Standing - Level of Assistance: 5: Stand by assistance Extremity Assessment  RLE Assessment RLE Assessment: Within Functional Limits LLE Assessment LLE Assessment: Exceptions to [facility] Strength Comments: 4/5 hip musculature, 3+/5 knee  musculature, 5/5 DF   See Function Navigator for Current Functional Status.  Luis Wiley 09/11/2017, 9:30 AM

## 2017-09-11 NOTE — Discharge Summary (Signed)
NAMEANDRON, MARRAZZO MEDICAL RECORD NT:[id number] ACCOUNT [id number] DATE OF BIRTH:1955-07-24 FACILITY: [facility] LOCATION: [facility] PHYSICIAN:ANDREW Letta Pate, MD  DISCHARGE SUMMARY  DATE OF DISCHARGE:  09/12/2017  ADMIT DATE:  09/05/2017  DISCHARGE DATE:  09/12/2017  DISCHARGE DIAGNOSES: 1.  Right thalamic hemorrhage. 2.  SCDs for deep venous thrombosis prophylaxis. 3.  Hypertension. 4.  Chronic kidney disease, stage II. 5.  Tobacco abuse.   6.  Microcytic anemia.  HISTORY OF PRESENT ILLNESS:  This is a 62 year old right-handed male with history of tobacco abuse.  He lives with a sister.  Independent prior to admission.  Presented 09/02/2017 with left-sided weakness, altered mental status and slurred speech.  Blood  pressure 496 systolic.  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  No midline shift or hydrocephalus.  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  The patient did not receive tPA.  CT angiogram of the  head and neck showed widespread intracranial atherosclerosis without large vessel occlusion.  Echocardiogram with ejection fraction of 75%, grade I diastolic dysfunction.  Creatinine mildly elevated at 1.50 and monitored.  Neurology followup.   Conservative care.  Monitoring of blood pressure.  Tolerating a regular diet.  The patient was admitted for comprehensive rehabilitation program.  PAST MEDICAL HISTORY:  See discharge diagnoses.  SOCIAL HISTORY:  Lives with his sister.  Independent prior to admission.  FUNCTIONAL STATUS:  Upon admission to rehab services was min mod assist 150 feet rolling walker, minimal assist sit to stand, min mod assist with activities of daily living.  PHYSICAL EXAMINATION: VITAL SIGNS:  Blood pressure 172/99, pulse 60, temperature 98, respirations 18. GENERAL:  Alert male in no acute distress. HEENT:  EOMs intact. NECK:  Supple, nontender, no JVD. CARDIOVASCULAR:  Rate controlled. ABDOMEN:   Soft, nontender, good bowel sounds.  REHABILITATION HOSPITAL COURSE:  The patient was admitted to inpatient rehabilitation services.  Therapies initiated on a 3-hour daily basis, consisting of physical therapy, occupational therapy and rehabilitation nursing.  The following issues were  addressed during patient's rehabilitation stay.  Pertaining to the patient's right thalamic hemorrhage, remained stable.  Conservative care.  Close monitoring of blood pressure on lisinopril.  He did have a history of tobacco abuse.  Maintained on a  Nicoderm patch receiving full counts regards to cessation of nicotine products.  CKD stage II, stable.  Latest creatinine 1.47.  The patient received weekly collaborative interdisciplinary team conferences to discuss estimated length of stay, family  teaching, any barriers to his discharge.  Ambulates 150 feet contact guard assist working with energy conservation techniques.  Showing increase independence with his mobility.  Gathers his belongings for activities of daily living and homemaking.   Completing dressing tasks.  Sit to stand from chair.  Engaged in functional ambulation in the hallway to the ADL apartment.  It was advised no driving and plan was discharged to home with family.  DISCHARGE MEDICATIONS:  Included Lipitor 20 mg p.o. daily, lisinopril 40 mg p.o. daily, Nicoderm patch 14 mg daily, Protonix 40 mg p.o. daily, Senokot 1 tablet p.o. b.i.d.   Special instructions.  No smoking drinking or alcohol.  No aspirin products  His diet was regular.  He would follow up with Dr. Alysia Penna at the outpatient rehab center as directed; Dr. Erlinda Hong of [facility], call for appointment; Kathe Becton, medical management.  SPECIAL INSTRUCTIONS:  No driving.  TN/NUANCE F:[id number] T:09/11/2017 JOB:[id number]

## 2017-09-11 NOTE — Progress Notes (Signed)
Speech Language Pathology Daily Session Note  Patient Details  Name: Luis Wiley MRN: [id number] Date of Birth: 07/17/1955  Today's Date: 09/11/2017 SLP Individual Time: 0900-1000 SLP Individual Time Calculation (min): 60 min  Short Term Goals: Week 1: SLP Short Term Goal 1 (Week 1): STGs=LTGs due to short length of stay   Skilled Therapeutic Interventions: Skilled treatment session focused on cognitive goals. SLP facilitated session by providing extra time and Max A verbal cues for attention and initiation during a medication management task of organizing a BID pill box. Patient also required total A to self-monitor and correct errors throughout the task. Patient also continues to require total A for intellectual awareness of cognitive deficits and their impact on his overall function and safety at home. While SLP was pushing the patient back to his room in the wheelchair, his brief fell out of his left pant leg. Patient with no awareness of this and required Max A multimodal cues for problem solving throughout task in regards to donning/doffing brief/clothes. Patient left with RN. Continue with current plan of care.      Function:    Cognition Comprehension Comprehension assist level: Understands basic 50 - 74% of the time/ requires cueing 25 - 49% of the time  Expression   Expression assist level: Expresses basic 50 - 74% of the time/requires cueing 25 - 49% of the time. Needs to repeat parts of sentences.  Social Interaction Social Interaction assist level: Interacts appropriately 25 - 49% of time - Needs frequent redirection.  Problem Solving Problem solving assist level: Solves basic 25 - 49% of the time - needs direction more than half the time to initiate, plan or complete simple activities  Memory Memory assist level: Recognizes or recalls 25 - 49% of the time/requires cueing 50 - 75% of the time    Pain No/Denies Pain   Therapy/Group: Individual Therapy  Dannah Ryles,  Luis Wiley 09/11/2017, 12:41 PM

## 2017-09-11 NOTE — Discharge Summary (Signed)
Discharge summary job # [id number]

## 2017-09-11 NOTE — Progress Notes (Signed)
Notified D, [place], PA of VS recorded, No new orders at this time.

## 2017-09-12 NOTE — Plan of Care (Signed)
  Problem: Consults Goal: RH STROKE PATIENT EDUCATION Description See Patient Education module for education specifics  Outcome: Adequate for Discharge   Problem: RH BOWEL ELIMINATION Goal: RH STG MANAGE BOWEL WITH ASSISTANCE Description STG Manage Bowel with min Assistance.  Outcome: Adequate for Discharge Goal: RH STG MANAGE BOWEL W/MEDICATION W/ASSISTANCE Description STG Manage Bowel with Medication with  [place].  Outcome: Adequate for Discharge   Problem: RH BLADDER ELIMINATION Goal: RH STG MANAGE BLADDER WITH ASSISTANCE Description STG Manage Bladder With min assistance  Outcome: Adequate for Discharge   Problem: RH SAFETY Goal: RH STG ADHERE TO SAFETY PRECAUTIONS W/ASSISTANCE/DEVICE Description STG Adhere to Safety Precautions With mod Assistance/Device.  Outcome: Adequate for Discharge   Problem: RH COGNITION-NURSING Goal: RH STG USES MEMORY AIDS/STRATEGIES W/ASSIST TO PROBLEM SOLVE Description STG Uses Memory Aids/Strategies With mod Assistance to Problem Solve.  Outcome: Adequate for Discharge   Problem: RH KNOWLEDGE DEFICIT Goal: RH STG INCREASE KNOWLEDGE OF HYPERTENSION Outcome: Adequate for Discharge Goal: RH STG INCREASE KNOWLEGDE OF HYPERLIPIDEMIA Outcome: Adequate for Discharge Goal: RH STG INCREASE KNOWLEDGE OF STROKE PROPHYLAXIS Outcome: Adequate for Discharge

## 2017-09-12 NOTE — Progress Notes (Signed)
Patient discharged home.  Left floor via wheelchair, escorted by nursing staff and family.  Patient and family verbalized understanding of discharge instructions as given by Marlowe [place], PA.  All patient belongings sent with patient, including DME and prescriptions.  Appears to be in no immediate distress at this time.  Brita Romp, RN

## 2017-09-12 NOTE — Plan of Care (Signed)
  Problem: Consults Goal: RH STROKE PATIENT EDUCATION Description See Patient Education module for education specifics  Outcome: Completed/Met   Problem: RH BOWEL ELIMINATION Goal: RH STG MANAGE BOWEL WITH ASSISTANCE Description STG Manage Bowel with min Assistance.  Outcome: Completed/Met Goal: RH STG MANAGE BOWEL W/MEDICATION W/ASSISTANCE Description STG Manage Bowel with Medication with  [place].  Outcome: Completed/Met   Problem: RH BLADDER ELIMINATION Goal: RH STG MANAGE BLADDER WITH ASSISTANCE Description STG Manage Bladder With min assistance  Outcome: Completed/Met   Problem: RH SAFETY Goal: RH STG ADHERE TO SAFETY PRECAUTIONS W/ASSISTANCE/DEVICE Description STG Adhere to Safety Precautions With mod Assistance/Device.  Outcome: Completed/Met   Problem: RH COGNITION-NURSING Goal: RH STG USES MEMORY AIDS/STRATEGIES W/ASSIST TO PROBLEM SOLVE Description STG Uses Memory Aids/Strategies With mod Assistance to Problem Solve.  Outcome: Completed/Met   Problem: RH KNOWLEDGE DEFICIT Goal: RH STG INCREASE KNOWLEDGE OF HYPERTENSION Outcome: Completed/Met Goal: RH STG INCREASE KNOWLEGDE OF HYPERLIPIDEMIA Outcome: Completed/Met Goal: RH STG INCREASE KNOWLEDGE OF STROKE PROPHYLAXIS Outcome: Completed/Met    Stable for discharge with supervision/min assist from family

## 2017-09-12 NOTE — Progress Notes (Addendum)
Social Work  Discharge Note  The overall goal for the admission was met for:   Discharge location: [place]  Length of Stay: Yes-8 DAYS  Discharge activity level: Yes-SUPERVISION LEVEL  Home/community participation: Yes  Services provided included: MD, RD, PT, OT, SLP, RN, CM, Pharmacy, Neuropsych and SW  Financial Services: Other: APPLYING FOR MEDICAID  Follow-up services arranged: Home Health: [facility]-PT,OT,SP,SW, DME: [facility] and Patient/Family has no preference for HH/DME agencies  Comments (or additional information):PT WILL NEED 24 HR SUPERVISION DUE TO COGNITIVE DEFICITS AND MEMORY ISSUES. SISTER WAS MADE AWARE OF THIS AND REPORTS SOMEONE IS ALWAYS IN OUR HOME. SHE DECLINED EDUCATION DUE TO SHE IS A RN AND FEELS SHE KNOWS WHAT TO DO FOR HIM AND DID NOT HAVE TIME TO COME IN FOR EDUCATION. PCP APPOINTMENT AT [facility] FOR 8/20 @ 9:20 AM. FOR MEDICAL FOLLOW UP AND WILL ASSIST WITH MEDICATION UNTIL HE RECEIVE MEDICAID. MEDICAID AND DISABILITY APPLICATIONS PENDING. MATCH GIVEN TO SISTER FOR MEDICATION ASSISTANCE.    Patient/Family verbalized understanding of follow-up arrangements: Yes  Individual responsible for coordination of the follow-up plan: [name]-SISTER  Confirmed correct DME delivered: Elease Hashimoto 09/12/2017    Elease Hashimoto

## 2017-09-12 NOTE — Progress Notes (Signed)
Speech Language Pathology Discharge Summary  Patient Details  Name: LEYLAND KENNA MRN: [id number] Date of Birth: 1955/02/15  Patient has met 1 of 4 long term goals.  Patient to discharge at overall Max level.   Reasons goals not met: Patient continues to require overall Max A for functional problem solving, recall and awareness    Clinical Impression/Discharge Summary: Patient continues to demonstrate severe cognitive impairments and has made minimal functional gains due to short length of stay and has met 1 of 4 LTGs. Overall, patient requires Max A multimodal cues for functional problem solving, recall of functional information, attention and awareness which impacts his safety with functional and familiar tasks. Education complete and patient will discharge home with 24 hour supervision. Patient would benefit from f/u SLP services to maximize his cognitive function and overall functional independence in order to reduce caregiver burden.   Care Partner:  Caregiver Able to Provide Assistance: Yes  Type of Caregiver Assistance: Cognitive  Recommendation:  Home Health SLP;24 hour supervision/assistance  Rationale for SLP Follow Up: Maximize cognitive function and independence;Reduce caregiver burden   Equipment: N/A    Reasons for discharge: Discharged from hospital   Patient/Family Agrees with Progress Made and Goals Achieved: Yes     [place], [place] 09/12/2017, 6:43 AM

## 2017-09-16 ENCOUNTER — Encounter: Payer: Self-pay | Admitting: Family Medicine

## 2017-09-16 ENCOUNTER — Ambulatory Visit (INDEPENDENT_AMBULATORY_CARE_PROVIDER_SITE_OTHER): Payer: Medicaid Other | Admitting: Family Medicine

## 2017-09-16 ENCOUNTER — Telehealth: Payer: Self-pay

## 2017-09-16 ENCOUNTER — Encounter: Payer: Self-pay | Admitting: Physical Medicine & Rehabilitation

## 2017-09-16 VITALS — BP 162/90 | HR 60 | Temp 98.1°F | Ht 72.0 in | Wt 157.4 lb

## 2017-09-16 DIAGNOSIS — Z832 Family history of diseases of the blood and blood-forming organs and certain disorders involving the immune mechanism: Secondary | ICD-10-CM

## 2017-09-16 DIAGNOSIS — Z09 Encounter for follow-up examination after completed treatment for conditions other than malignant neoplasm: Secondary | ICD-10-CM | POA: Diagnosis not present

## 2017-09-16 DIAGNOSIS — I10 Essential (primary) hypertension: Secondary | ICD-10-CM | POA: Diagnosis not present

## 2017-09-16 DIAGNOSIS — D649 Anemia, unspecified: Secondary | ICD-10-CM

## 2017-09-16 DIAGNOSIS — Z716 Tobacco abuse counseling: Secondary | ICD-10-CM

## 2017-09-16 DIAGNOSIS — I619 Nontraumatic intracerebral hemorrhage, unspecified: Secondary | ICD-10-CM | POA: Diagnosis not present

## 2017-09-16 DIAGNOSIS — R110 Nausea: Secondary | ICD-10-CM

## 2017-09-16 DIAGNOSIS — Z720 Tobacco use: Secondary | ICD-10-CM

## 2017-09-16 MED ORDER — ONDANSETRON HCL 4 MG PO TABS: 4.0000 mg | ORAL_TABLET | Freq: Three times a day (TID) | ORAL | 1 refills | Status: AC | PRN

## 2017-09-16 NOTE — Telephone Encounter (Signed)
Transitional Care call-sister [name]    1. Are you/is patient experiencing any problems since coming home? nausea Are there any questions regarding any aspect of care? No Are there any questions regarding medications administration/dosing? No Are meds being taken as prescribed? Yes  Patient should review meds with caller to confirm 2. Have there been any falls? One, felt nauseated 3. Has Home Health been to the house and/or have they contacted you?PT has evaluated, not OT, SP or SW If not, have you tried to contact them?No Can we help you contact them? 4. Are bowels and bladder emptying properly?Yes Are there any unexpected incontinence issues? Two episodes If applicable, is patient following bowel/bladder programs? 5. Any fevers, problems with breathing, unexpected pain?No 6. Are there any skin problems or new areas of breakdown?No 7. Has the patient/family member arranged specialty MD follow up (ie cardiology/neurology/renal/surgical/etc)?  Yes Can we help arrange? 8. Does the patient need any other services or support that we can help arrange?No 9. Are caregivers following through as expected in assisting the patient? Yes 10. Has the patient quit smoking, drinking alcohol, or using drugs as recommended? Not quit smoking  Appointment time, arrive time 12:40pm for 1:00pm appt with Dr. Letta Pate [phone]

## 2017-09-16 NOTE — Progress Notes (Signed)
New Patient-Establish   Subjective:    Patient ID: Luis Wiley, male    DOB: 1955-12-25, 62 y.o.   MRN: [id number]   Chief Complaint  Patient presents with  . Hospitalization Follow-up   HPI  Luis Wiley has a past medical history of Stroke, Tobacco Abuse, and Hypertension. He is here today to establish care.   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 He has residual left extremity weakness and numbness. He is stable today and accompanied by his sister. He continues to smoke < 1/2 pack of cigarettes a day. He denies visual changes, chest pain, heart palpitations, and falls. He has occasionally headaches and dizziness with position changes. Denies severe headaches, confusion, seizures, double vision, and blurred vision, nausea and vomiting.  He denies fevers, chills, fatigue, recent infections, weight loss, and night sweats.   He has not had any visual changes, and falls. No shortness of breath reported.   No reports of GI problems such as diarrhea, and constipation. He has no reports of blood in stools, dysuria and hematuria.   No depression or anxiety reported.   He has  denies pain today.   Review of Systems  Constitutional: Negative.   HENT: Negative.   Eyes: Negative.   Respiratory: Negative.   Cardiovascular: Negative.   Gastrointestinal: Positive for nausea (occasional).  Endocrine: Negative.   Genitourinary: Negative.   Musculoskeletal: Positive for arthralgias (generalized).  Skin: Negative.   Allergic/Immunologic: Negative.   Neurological: Positive for weakness (Left extremity) and numbness (Left hand).  Psychiatric/Behavioral: Negative.    Objective:   Physical Exam  Constitutional: He is oriented to person, place, and time.  HENT:  Head: Normocephalic and atraumatic.  Right Ear: External ear normal.  Left Ear: External ear normal.  Nose: Nose normal.  Mouth/Throat: Oropharynx is clear and moist.   Eyes: Pupils are equal, round, and reactive to light. Conjunctivae and EOM are normal.  Neck: Normal range of motion. Neck supple. Thyromegaly:    Cardiovascular: Normal rate, regular rhythm, normal heart sounds and intact distal pulses.  Pulmonary/Chest: Effort normal and breath sounds normal.  Abdominal: Soft. Bowel sounds are normal.  Musculoskeletal: Normal range of motion.  Neurological: He is alert and oriented to person, place, and time.  Skin: Skin is warm and dry. Capillary refill takes less than 2 seconds.  Psychiatric: He has a normal mood and affect. His behavior is normal. Judgment and thought content normal.   Assessment & Plan:   1. Hospital discharge follow up   2. 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 He continues to have residual left-sided weakness.   3. Essential hypertension Blood pressure continues to be elevated at 162/90 today. We will initiate additional antihypertensive med, Amlodipine today. He will continue Lisinopril as prescribed. We will reassess effectiveness at next office visit.  - POCT urinalysis dipstick - amLODipine (NORVASC) 10 MG tablet; Take 1 tablet (10 mg total) by mouth daily.  Dispense: 90 tablet; Refill: 1  4. Anemia, unspecified type Hgb at 8.6 on 09/08/2017. He will continue oral iron as prescribed.   5. Family history of thalassemia We will assess today. - Alpha-Thalassemia GenotypR  6. Nausea - ondansetron (ZOFRAN) 4 MG tablet; Take 1 tablet (4 mg total) by mouth every 8 (eight) hours as needed for nausea or vomiting.  Dispense: 20 tablet; Refill: 1  7. Tobacco abuse Currently smoking < 1/2 pack of cigarettes daily.   8. Encounter for smoking cessation counseling He  is not ready to quit smoking, but will let us know when.   9. Follow up He will follow up in 1 month.  Meds ordered this encounter  Medications  . ondansetron (ZOFRAN) 4 MG tablet    Sig: Take 1 tablet (4 mg total) by mouth every 8 (eight) hours as needed  for nausea or vomiting.    Dispense:  20 tablet    Refill:  1  . amLODipine (NORVASC) 10 MG tablet    Sig: Take 1 tablet (10 mg total) by mouth daily.    Dispense:  90 tablet    Refill:  [place],  MSN, FNP-C Patient [facility] [address] [place], [place] [address] [phone]

## 2017-09-17 ENCOUNTER — Encounter: Payer: Self-pay | Admitting: Family Medicine

## 2017-09-17 ENCOUNTER — Telehealth: Payer: Self-pay

## 2017-09-17 MED ORDER — AMLODIPINE BESYLATE 10 MG PO TABS: 10.0000 mg | ORAL_TABLET | Freq: Every day | ORAL | 1 refills | Status: AC

## 2017-09-17 NOTE — Telephone Encounter (Signed)
Patient notified

## 2017-09-17 NOTE — Telephone Encounter (Signed)
-----   Message from Azzie Glatter, FNP sent at 09/17/2017 10:38 AM EDT ----- Regarding: "Additional BP Medication" Luis Wiley,   Please inform patient's sister that we sent blood pressure medication, Norvasc 10 mg to pharmacy today. He is to continue Lisinopril as prescribed also for blood pressure.   Remind him that it is also important for him to take OTC oral Iron pill daily, because of his low Hgb.   Thanks.

## 2017-09-17 NOTE — Patient Instructions (Addendum)
Amlodipine tablets What is this medicine? AMLODIPINE (am LOE di peen) is a calcium-channel blocker. It affects the amount of calcium found in your heart and muscle cells. This relaxes your blood vessels, which can reduce the amount of work the heart has to do. This medicine is used to lower high blood pressure. It is also used to prevent chest pain. This medicine may be used for other purposes; ask your health care provider or pharmacist if you have questions. COMMON BRAND NAME(S): Norvasc What should I tell my health care provider before I take this medicine? They need to know if you have any of these conditions: -heart problems like heart failure or aortic stenosis -liver disease -an unusual or allergic reaction to amlodipine, other medicines, foods, dyes, or preservatives -pregnant or trying to get pregnant -breast-feeding How should I use this medicine? Take this medicine by mouth with a glass of water. Follow the directions on the prescription label. Take your medicine at regular intervals. Do not take more medicine than directed. Talk to your pediatrician regarding the use of this medicine in children. Special care may be needed. This medicine has been used in children as young as 6. Persons over 26 years old may have a stronger reaction to this medicine and need smaller doses. Overdosage: If you think you have taken too much of this medicine contact a poison control center or emergency room at once. NOTE: This medicine is only for you. Do not share this medicine with others. What if I miss a dose? If you miss a dose, take it as soon as you can. If it is almost time for your next dose, take only that dose. Do not take double or extra doses. What may interact with this medicine? -herbal or dietary supplements -local or general anesthetics -medicines for high blood pressure -medicines for prostate problems -rifampin This list may not describe all possible interactions. Give your health  care provider a list of all the medicines, herbs, non-prescription drugs, or dietary supplements you use. Also tell them if you smoke, drink alcohol, or use illegal drugs. Some items may interact with your medicine. What should I watch for while using this medicine? Visit your doctor or health care professional for regular check ups. Check your blood pressure and pulse rate regularly. Ask your health care professional what your blood pressure and pulse rate should be, and when you should contact him or her. This medicine may make you feel confused, dizzy or lightheaded. Do not drive, use machinery, or do anything that needs mental alertness until you know how this medicine affects you. To reduce the risk of dizzy or fainting spells, do not sit or stand up quickly, especially if you are an older patient. Avoid alcoholic drinks; they can make you more dizzy. Do not suddenly stop taking amlodipine. Ask your doctor or health care professional how you can gradually reduce the dose. What side effects may I notice from receiving this medicine? Side effects that you should report to your doctor or health care professional as soon as possible: -allergic reactions like skin rash, itching or hives, swelling of the face, lips, or tongue -breathing problems -changes in vision or hearing -chest pain -fast, irregular heartbeat -swelling of legs or ankles Side effects that usually do not require medical attention (report to your doctor or health care professional if they continue or are bothersome): -dry mouth -facial flushing -nausea, vomiting -stomach gas, pain -tired, weak -trouble sleeping This list may not describe all possible side  effects. Call your doctor for medical advice about side effects. You may report side effects to FDA at 1-800-FDA-1088. Where should I keep my medicine? Keep out of the reach of children. Store at room temperature between 59 and 86 degrees F (15 and 30 degrees C). Protect from  light. Keep container tightly closed. Throw away any unused medicine after the expiration date. NOTE: This sheet is a summary. It may not cover all possible information. If you have questions about this medicine, talk to your doctor, pharmacist, or health care provider.  2018 Elsevier/Gold Standard (2011-12-13 11:40:58)   Ondansetron tablets What is this medicine? ONDANSETRON (on DAN se tron) is used to treat nausea and vomiting caused by chemotherapy. It is also used to prevent or treat nausea and vomiting after surgery. This medicine may be used for other purposes; ask your health care provider or pharmacist if you have questions. COMMON BRAND NAME(S): Zofran What should I tell my health care provider before I take this medicine? They need to know if you have any of these conditions: -heart disease -history of irregular heartbeat -liver disease -low levels of magnesium or potassium in the blood -an unusual or allergic reaction to ondansetron, granisetron, other medicines, foods, dyes, or preservatives -pregnant or trying to get pregnant -breast-feeding How should I use this medicine? Take this medicine by mouth with a glass of water. Follow the directions on your prescription label. Take your doses at regular intervals. Do not take your medicine more often than directed. Talk to your pediatrician regarding the use of this medicine in children. Special care may be needed. Overdosage: If you think you have taken too much of this medicine contact a poison control center or emergency room at once. NOTE: This medicine is only for you. Do not share this medicine with others. What if I miss a dose? If you miss a dose, take it as soon as you can. If it is almost time for your next dose, take only that dose. Do not take double or extra doses. What may interact with this medicine? Do not take this medicine with any of the following medications: -apomorphine -certain medicines for fungal  infections like fluconazole, itraconazole, ketoconazole, posaconazole, voriconazole -cisapride -dofetilide -dronedarone -pimozide -thioridazine -ziprasidone This medicine may also interact with the following medications: -carbamazepine -certain medicines for depression, anxiety, or psychotic disturbances -fentanyl -linezolid -MAOIs like Carbex, Eldepryl, Marplan, Nardil, and Parnate -methylene blue (injected into a vein) -other medicines that prolong the QT interval (cause an abnormal heart rhythm) -phenytoin -rifampicin -tramadol This list may not describe all possible interactions. Give your health care provider a list of all the medicines, herbs, non-prescription drugs, or dietary supplements you use. Also tell them if you smoke, drink alcohol, or use illegal drugs. Some items may interact with your medicine. What should I watch for while using this medicine? Check with your doctor or health care professional right away if you have any sign of an allergic reaction. What side effects may I notice from receiving this medicine? Side effects that you should report to your doctor or health care professional as soon as possible: -allergic reactions like skin rash, itching or hives, swelling of the face, lips or tongue -breathing problems -confusion -dizziness -fast or irregular heartbeat -feeling faint or lightheaded, falls -fever and chills -loss of balance or coordination -seizures -sweating -swelling of the hands or feet -tightness in the chest -tremors -unusually weak or tired Side effects that usually do not require medical attention (report to your  doctor or health care professional if they continue or are bothersome): -constipation or diarrhea -headache This list may not describe all possible side effects. Call your doctor for medical advice about side effects. You may report side effects to FDA at 1-800-FDA-1088. Where should I keep my medicine? Keep out of the reach of  children. Store between 2 and 30 degrees C (36 and 86 degrees F). Throw away any unused medicine after the expiration date. NOTE: This sheet is a summary. It may not cover all possible information. If you have questions about this medicine, talk to your doctor, pharmacist, or health care provider.  2018 Elsevier/Gold Standard (2012-10-21 16:27:45)

## 2017-09-26 ENCOUNTER — Inpatient Hospital Stay: Payer: Self-pay | Admitting: Physical Medicine & Rehabilitation

## 2017-09-26 ENCOUNTER — Encounter: Payer: Self-pay | Attending: Physical Medicine & Rehabilitation

## 2017-09-28 DIAGNOSIS — I639 Cerebral infarction, unspecified: Secondary | ICD-10-CM

## 2017-09-28 HISTORY — DX: Cerebral infarction, unspecified: I63.9

## 2017-10-08 ENCOUNTER — Ambulatory Visit: Payer: MEDICAID | Admitting: Adult Health

## 2017-10-14 ENCOUNTER — Ambulatory Visit: Payer: MEDICAID | Admitting: Adult Health
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
## 2017-10-15 ENCOUNTER — Encounter: Payer: Self-pay | Admitting: Adult Health

## 2017-10-17 ENCOUNTER — Encounter: Payer: Self-pay | Admitting: Adult Health

## 2017-10-17 ENCOUNTER — Ambulatory Visit: Payer: MEDICAID | Admitting: Adult Health

## 2017-10-17 ENCOUNTER — Telehealth: Payer: Self-pay | Admitting: Adult Health

## 2017-10-17 ENCOUNTER — Ambulatory Visit: Payer: Self-pay | Admitting: Family Medicine

## 2017-10-17 VITALS — BP 105/60 | HR 86 | Ht 72.0 in

## 2017-10-17 DIAGNOSIS — I10 Essential (primary) hypertension: Secondary | ICD-10-CM

## 2017-10-17 DIAGNOSIS — I610 Nontraumatic intracerebral hemorrhage in hemisphere, subcortical: Secondary | ICD-10-CM

## 2017-10-17 NOTE — Telephone Encounter (Signed)
Medicaid order sent to GI. They will reach out to the pt to schedule.  °

## 2017-10-17 NOTE — Patient Instructions (Signed)
Continue lipitor  for secondary stroke prevention  Continue to follow up with PCP regarding cholesterol and blood pressure management   Referral placed for home PT/OT/ST  Ordered placed for CT head to follow up on prior bleed - you will be called to schedule  Continue to monitor blood pressure at home  Maintain strict control of hypertension with blood pressure goal below 130/90, diabetes with hemoglobin A1c goal below 6.5% and cholesterol with LDL cholesterol (bad cholesterol) goal below 70 mg/dL. I also advised the patient to eat a healthy diet with plenty of whole grains, cereals, fruits and vegetables, exercise regularly and maintain ideal body weight.  Followup in the future with me in 3 months or call earlier if needed       Thank you for coming to see [place] at [facility]. I hope we have been able to provide you high quality care today.  You may receive a patient satisfaction survey over the next few weeks. We would appreciate your feedback and comments so that we may continue to improve ourselves and the health of our patients.

## 2017-10-17 NOTE — Progress Notes (Signed)
I agree with the above plan 
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
## 2017-10-19 ENCOUNTER — Emergency Department (HOSPITAL_COMMUNITY)
Admission: EM | Admit: 2017-10-19 | Discharge: 2017-10-19 | Disposition: A | Discharge status: Other Acute Inpatient Hospital | Payer: Medicaid Other | Attending: Emergency Medicine | Admitting: Emergency Medicine

## 2017-10-19 ENCOUNTER — Emergency Department (HOSPITAL_COMMUNITY): Payer: Medicaid Other

## 2017-10-19 ENCOUNTER — Encounter (HOSPITAL_COMMUNITY): Payer: Self-pay | Admitting: Emergency Medicine

## 2017-10-19 DIAGNOSIS — R531 Weakness: Secondary | ICD-10-CM | POA: Diagnosis not present

## 2017-10-19 DIAGNOSIS — T300 Burn of unspecified body region, unspecified degree: Secondary | ICD-10-CM

## 2017-10-19 DIAGNOSIS — F17210 Nicotine dependence, cigarettes, uncomplicated: Secondary | ICD-10-CM | POA: Insufficient documentation

## 2017-10-19 DIAGNOSIS — I10 Essential (primary) hypertension: Secondary | ICD-10-CM | POA: Insufficient documentation

## 2017-10-19 LAB — BASIC METABOLIC PANEL
ANION GAP: 12 (ref 5–15)
BUN: 16 mg/dL (ref 8–23)
CALCIUM: 9.6 mg/dL (ref 8.9–10.3)
CHLORIDE: 103 mmol/L (ref 98–111)
CO2: 25 mmol/L (ref 22–32)
Creatinine, Ser: 1.37 mg/dL — ABNORMAL HIGH (ref 0.61–1.24)
GFR calc Af Amer: >60 mL/min (ref >60)
GFR, EST NON AFRICAN AMERICAN: 54 mL/min — AB (ref >60)
GLUCOSE: 127 mg/dL — AB (ref 70–99)
Potassium: 4.3 mmol/L (ref 3.5–5.1)
Sodium: 140 mmol/L (ref 135–145)

## 2017-10-19 LAB — CBC
HCT: 30.8 % — ABNORMAL LOW (ref 39.0–52.0)
Hemoglobin: 8.7 g/dL — ABNORMAL LOW (ref 13.0–17.0)
MCH: 19.6 pg — ABNORMAL LOW (ref 26.0–34.0)
MCHC: 28.2 g/dL — ABNORMAL LOW (ref 30.0–36.0)
MCV: 69.2 fL — ABNORMAL LOW (ref 78.0–100.0)
PLATELETS: 673 10*3/uL — AB (ref 150–400)
RBC: 4.45 MIL/uL (ref 4.22–5.81)
RDW: 19.4 % — AB (ref 11.5–15.5)
WBC: 8.4 10*3/uL (ref 4.0–10.5)

## 2017-10-19 LAB — I-STAT TROPONIN, ED: Troponin i, poc: 0 ng/mL (ref 0.00–0.08)

## 2017-10-19 LAB — C-REACTIVE PROTEIN: CRP: 12 mg/dL — ABNORMAL HIGH (ref <1.0)

## 2017-10-19 LAB — I-STAT CG4 LACTIC ACID, ED: Lactic Acid, Venous: 1.54 mmol/L (ref 0.5–1.9)

## 2017-10-19 LAB — CBG MONITORING, ED: GLUCOSE-CAPILLARY: 126 mg/dL — AB (ref 70–99)

## 2017-10-19 LAB — SEDIMENTATION RATE: Sed Rate: 73 mm/hr — ABNORMAL HIGH (ref 0–16)

## 2017-10-19 MED ORDER — ONDANSETRON HCL 4 MG/2ML IJ SOLN
4.0000 mg | Freq: Once | INTRAMUSCULAR | Status: DC
  Filled 2017-10-19: qty 2

## 2017-10-19 MED ORDER — SODIUM CHLORIDE 0.9 % IV BOLUS
500.0000 mL | Freq: Once | INTRAVENOUS | Status: AC
  Administered 2017-10-19: 500 mL via INTRAVENOUS

## 2017-10-19 NOTE — ED Notes (Signed)
Sister [name] notified about pt being [facility].
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
## 2017-10-19 NOTE — ED Notes (Signed)
[facility] and transfer paperwork given to [facility]. Pt stable at transport
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
## 2017-10-19 NOTE — ED Notes (Signed)
Patient transported to CT 

## 2017-10-19 NOTE — ED Notes (Signed)
Sister [name] [phone]

## 2017-10-24 ENCOUNTER — Ambulatory Visit: Payer: Self-pay | Admitting: Family Medicine

## 2017-10-24 LAB — CULTURE, BLOOD (SINGLE)
CULTURE: NO GROWTH
SPECIAL REQUESTS: ADEQUATE

## 2018-01-16 ENCOUNTER — Ambulatory Visit: Payer: MEDICAID | Admitting: Adult Health

## 2019-03-19 IMAGING — CT CT HEAD W/O CM
4 series · 15 of 47 positions shown, 17 images · non-contrast
Comparison: 09/03/2017

CLINICAL DATA: Generalized weakness, altered level of consciousness

EXAM:
CT HEAD WITHOUT CONTRAST
TECHNIQUE: Contiguous axial images were obtained from the base of the skull
through the vertex without intravenous contrast.

[Series 3: head without · axial · non-contrast · 0.46mm/px · z∈[+100,+235]mm · 7 of 37 slices shown, 9 images]
[im 5/37  brain]
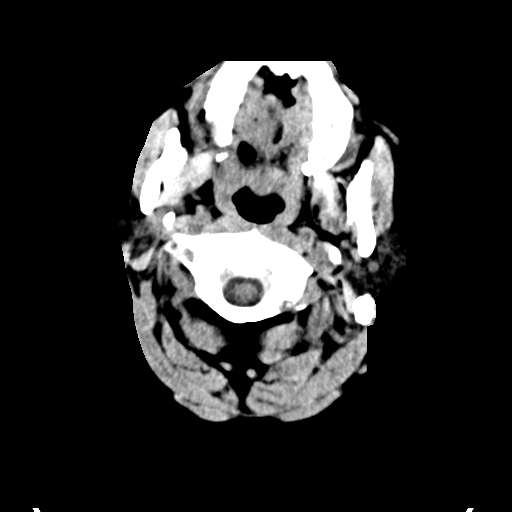
[im 5/37  bone]
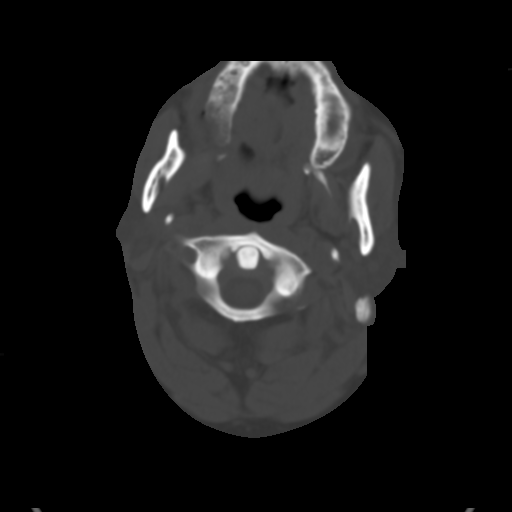
[im 10/37  brain]
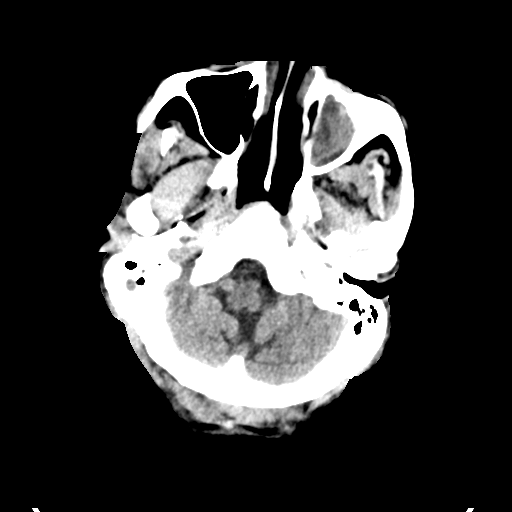
[im 14/37  brain]
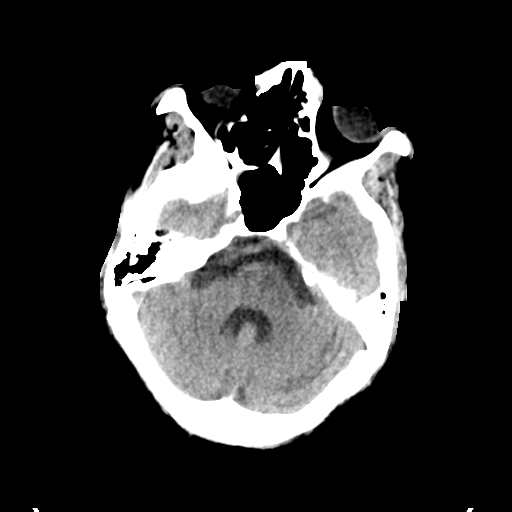
[im 19/37  brain]
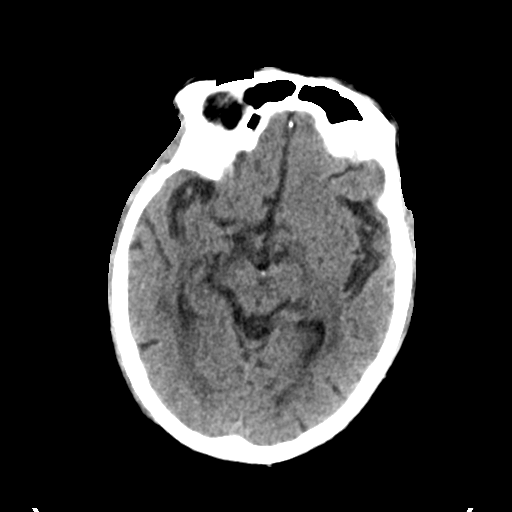
[im 23/37  brain]
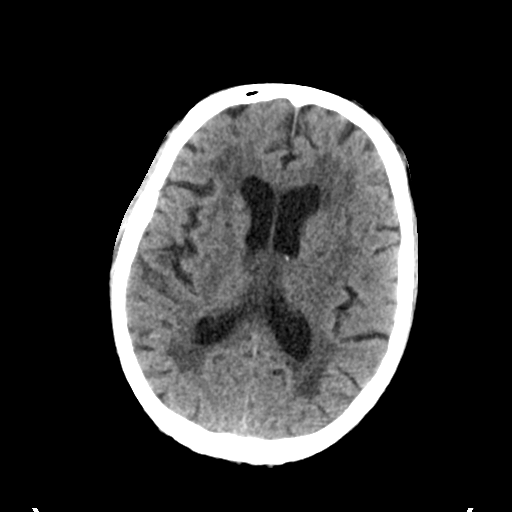
[im 23/37  bone]
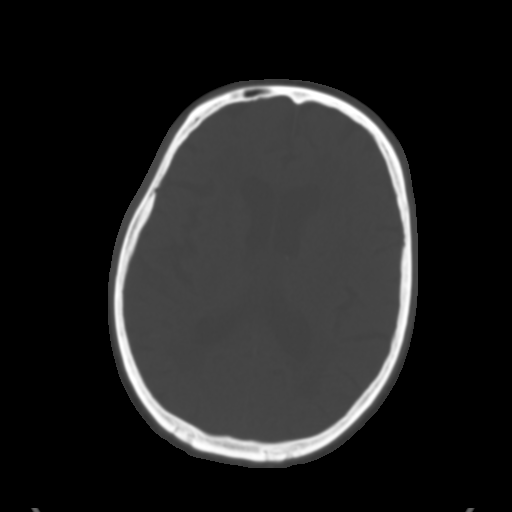
[im 28/37  brain]
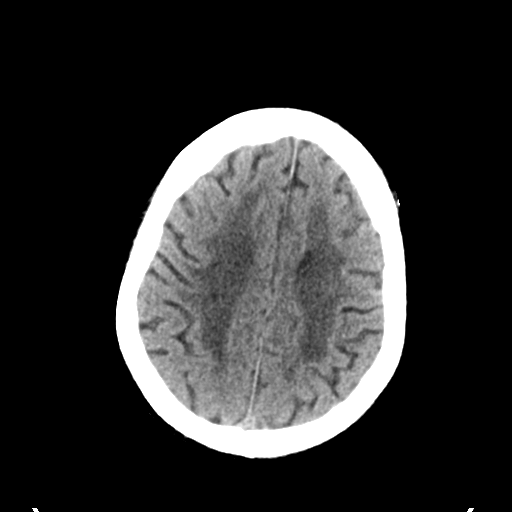
[im 32/37  brain]
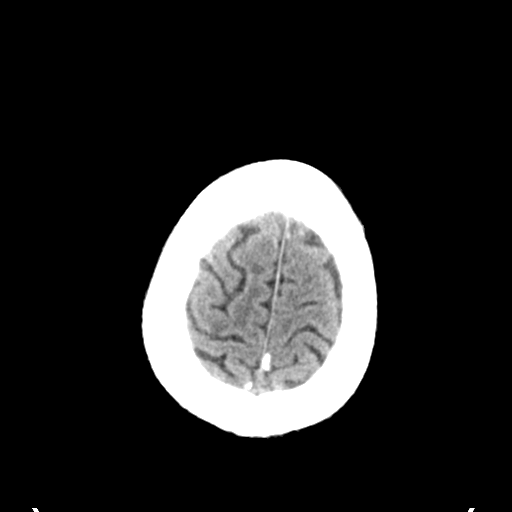

[Series 4: head bone · axial · 0.46mm/px · z∈[+98,+116]mm · 2 of 92 slices shown]
[im 10/92  bone]
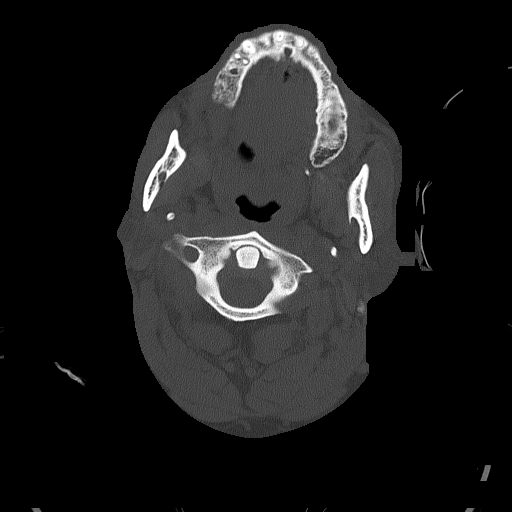
[im 19/92  bone]
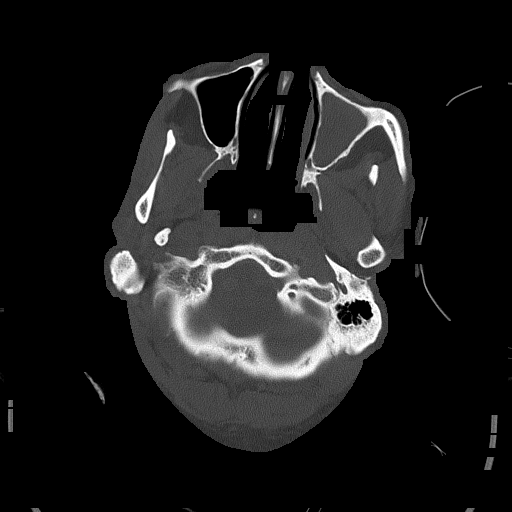

[Series 5: head without cor · coronal · non-contrast · 0.37mm/px · 3 of 70 slices shown]
[im 24/70  brain]
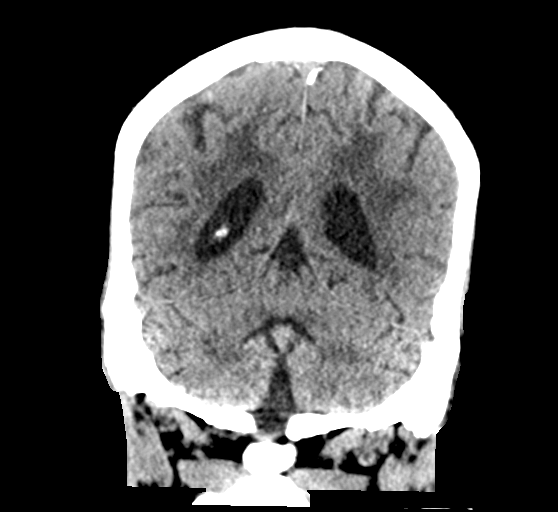
[im 31/70  brain]
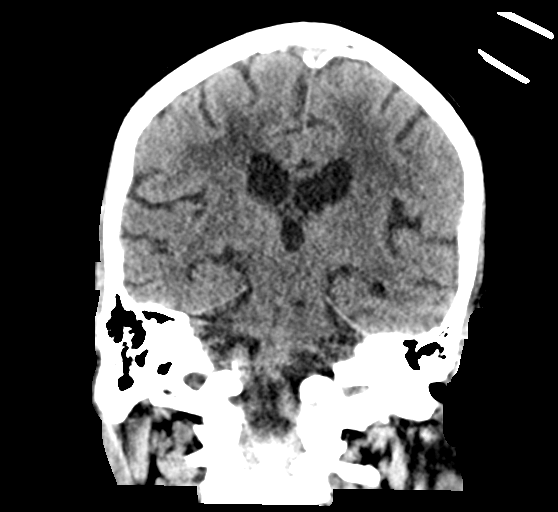
[im 39/70  brain]
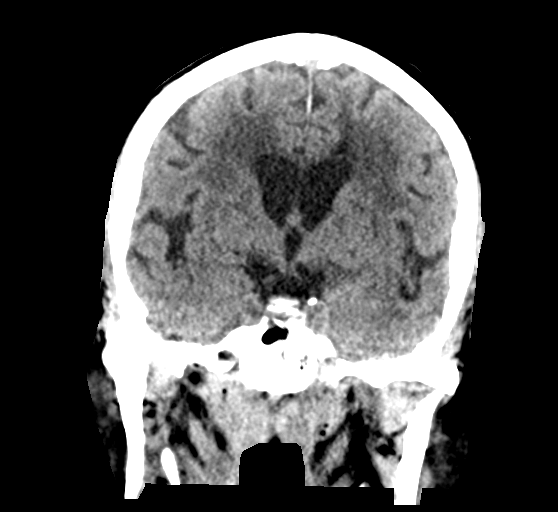

[Series 6: head without sag · sagittal · non-contrast · 0.36mm/px · 3 of 66 slices shown]
[im 22/66  brain]
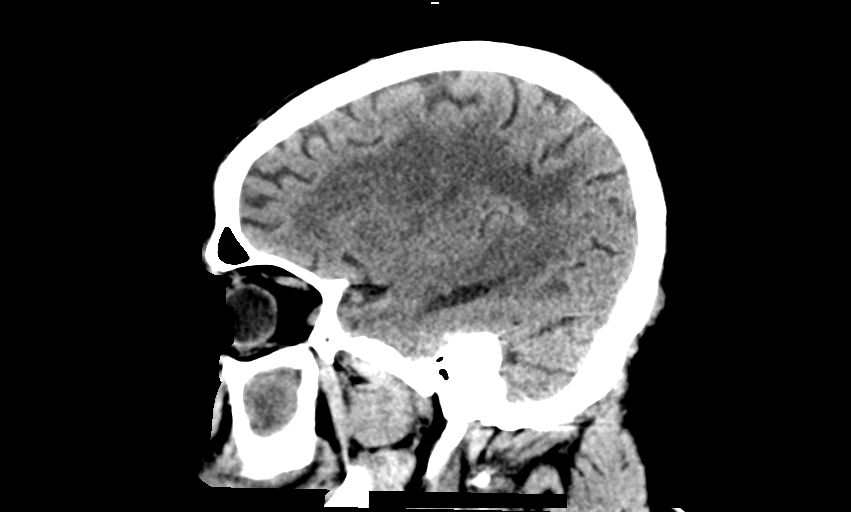
[im 33/66  brain]
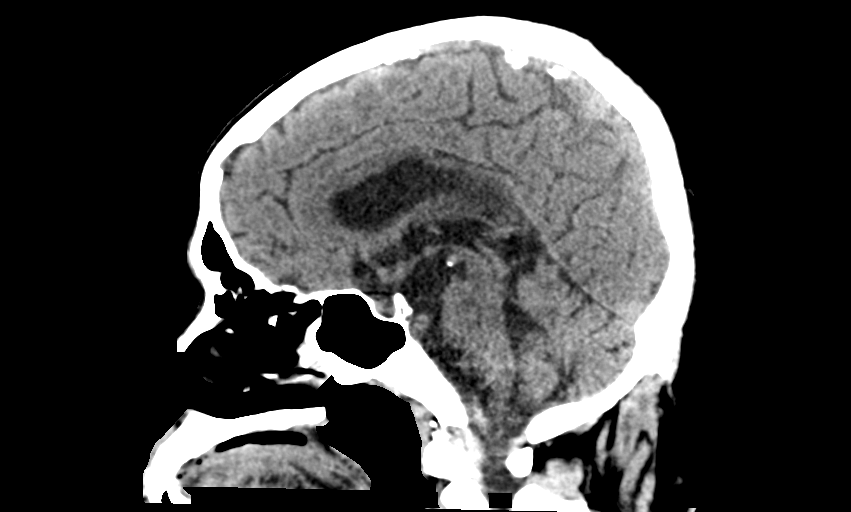
[im 44/66  brain]
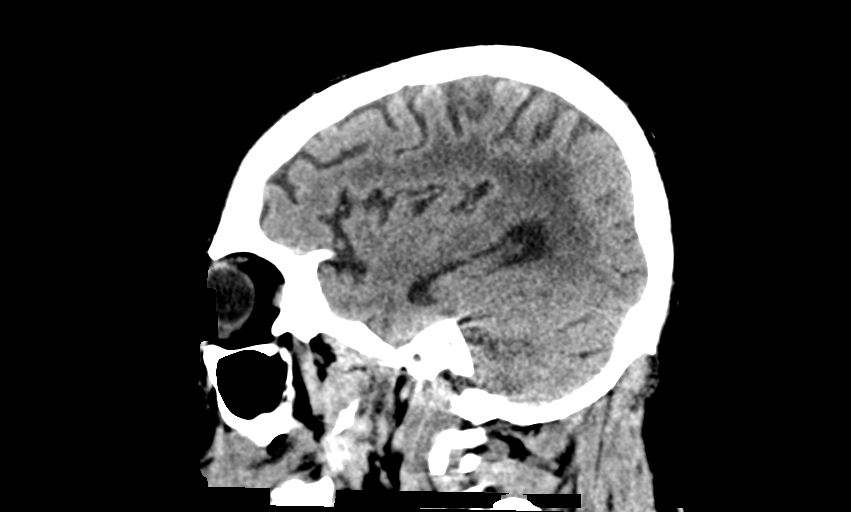

[15 of 47 positions shown; findings below may reference images not displayed]

FINDINGS: Brain: There is atrophy and chronic small vessel disease changes. No
acute intracranial abnormality. Specifically, no hemorrhage,
hydrocephalus, mass lesion, acute infarction, or significant
intracranial injury.

Vascular: No hyperdense vessel or unexpected calcification.

Skull: No acute calvarial abnormality.

Sinuses/Orbits: Opacified left maxillary sinus. Mastoid air cells
clear.

Other: None
IMPRESSION: Age advanced atrophy, chronic small vessel disease.

No acute intracranial abnormality.

## 2019-07-29 DIAGNOSIS — Z741 Need for assistance with personal care: Secondary | ICD-10-CM

## 2019-07-29 DIAGNOSIS — R269 Unspecified abnormalities of gait and mobility: Secondary | ICD-10-CM

## 2019-07-29 DIAGNOSIS — F0150 Vascular dementia without behavioral disturbance: Secondary | ICD-10-CM

## 2019-07-29 DIAGNOSIS — Z993 Dependence on wheelchair: Secondary | ICD-10-CM

## 2019-07-29 DIAGNOSIS — Z9181 History of falling: Secondary | ICD-10-CM

## 2019-07-29 HISTORY — DX: Unspecified abnormalities of gait and mobility: R26.9

## 2019-07-29 HISTORY — DX: History of falling: Z91.81

## 2019-07-29 HISTORY — DX: Vascular dementia, unspecified severity, without behavioral disturbance, psychotic disturbance, mood disturbance, and anxiety: F01.50

## 2019-07-29 HISTORY — DX: Dependence on wheelchair: Z99.3

## 2019-07-29 HISTORY — DX: Need for assistance with personal care: Z74.1

## 2019-08-11 ENCOUNTER — Telehealth: Payer: Self-pay | Admitting: Family Medicine

## 2019-08-11 NOTE — Telephone Encounter (Signed)
Spoke to [employer]. See previous note. Pt seen by Kathe Becton FNP. Informed pt contact info to schedule appt for pt there.

## 2019-08-11 NOTE — Telephone Encounter (Signed)
Returned call, no answer, lvm to return call. Patient is seen at Patient care center not [facility].   Copied from [place] [phone]. Topic: Appointment Scheduling - Scheduling Inquiry for Clinic >> Aug 09, 2019  1:25 PM Erick Blinks wrote: Pt's daughter called and would like pt seen soon for nursing home eval, pt's wife will be having back surgery within a month and will not be able to help take care of patient Luis Wiley  Best contact: [phone]

## 2019-08-13 ENCOUNTER — Encounter: Payer: Self-pay | Admitting: Family Medicine

## 2019-08-13 ENCOUNTER — Other Ambulatory Visit: Payer: Self-pay

## 2019-08-13 ENCOUNTER — Ambulatory Visit (INDEPENDENT_AMBULATORY_CARE_PROVIDER_SITE_OTHER): Payer: Medicaid Other | Admitting: Family Medicine

## 2019-08-13 VITALS — BP 174/106 | HR 70 | Temp 97.7°F | Resp 16

## 2019-08-13 DIAGNOSIS — Z7689 Persons encountering health services in other specified circumstances: Secondary | ICD-10-CM

## 2019-08-13 DIAGNOSIS — Z832 Family history of diseases of the blood and blood-forming organs and certain disorders involving the immune mechanism: Secondary | ICD-10-CM

## 2019-08-13 DIAGNOSIS — F0150 Vascular dementia without behavioral disturbance: Secondary | ICD-10-CM

## 2019-08-13 DIAGNOSIS — I10 Essential (primary) hypertension: Secondary | ICD-10-CM

## 2019-08-13 DIAGNOSIS — Z0000 Encounter for general adult medical examination without abnormal findings: Secondary | ICD-10-CM

## 2019-08-13 DIAGNOSIS — Z09 Encounter for follow-up examination after completed treatment for conditions other than malignant neoplasm: Secondary | ICD-10-CM

## 2019-08-13 DIAGNOSIS — I69311 Memory deficit following cerebral infarction: Secondary | ICD-10-CM

## 2019-08-13 DIAGNOSIS — Z9181 History of falling: Secondary | ICD-10-CM

## 2019-08-13 LAB — GLUCOSE, POCT (MANUAL RESULT ENTRY): POC Glucose: 96 mg/dl (ref 70–99)

## 2019-08-13 MED ORDER — CLONIDINE 0.2 MG/24HR TD PTWK: 0.2000 mg | MEDICATED_PATCH | TRANSDERMAL | 12 refills | Status: DC

## 2019-08-13 NOTE — Progress Notes (Addendum)
Patient [facility]   New Patient--Re-establish Care  Subjective:  Patient ID: Luis Wiley, male    DOB: [birth year]  Age: 64 y.o. MRN: [id number]  CC:  Chief Complaint  Patient presents with  . New Patient (Initial Visit)    Pt states he is here new pt.  Pt sister/ caregiver states he is here to get paperwork filled out so he can get him in an assisted living.    HPI Luis Wiley is a 64 year old male who presents to Establish Care today.    Patient Active Problem List   Diagnosis Date Noted  . Tobacco abuse 09/06/2017  . CKD (chronic kidney disease) stage 3, GFR 30-59 ml/min 09/06/2017  . Anemia 09/06/2017  . 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 Essential hypertension   . ICH (intracerebral hemorrhage) ([place]) 09/02/2017    Past Medical History:  Diagnosis Date  . Abnormality of gait and mobility 07/2019  . Arthritis   . Assistance needed for mobility 07/2019  . At high risk for falls 07/2019  . Chronic joint pain   . Hypertension   . Memory deficits 09/2019  . Stroke ([place]) 09/2017  . Wheelchair bound 07/2019   Current Status: This will be his initial office visit with me. He was previously a patient of our clinic, who has been lost to follow up. He is here today to re-establish care. He has not seen physician since he was 64 years old. He does have a history of Hypertension, but does not want to take medication. Since his last office visit, he is doing well with no complaints. He is accompanied by his sister, who is his primary caregiver and a Marine scientist. His sister reports that he does not even want to take Acetaminophen for his chronic Arthritis and joint pain. Patient is unable to stand without assistance, which sister uses a lift-chair to assist her with his ambulation. Patient has had recent fall, while changing position. Sister reports increasing signs and symptoms of memory loss since recent stroke. He denies fevers, chills,  fatigue, recent infections, weight loss, and night sweats. He has not had any headaches, and visual changes. No chest pain, heart palpitations, cough and shortness of breath reported. No reports of GI problems such as nausea, vomiting, diarrhea, and constipation. He has no reports of blood in stools, dysuria and hematuria. No depression or anxiety, and denies suicidal ideations, homicidal ideations, or auditory hallucinations. He is taking all medications as prescribed. He denies pain today.   No past surgical history on file.  Family History  Problem Relation Age of Onset  . Hypertension Mother   . Diabetes Mother     Social History   Socioeconomic History  . Marital status: Single    Spouse name: Not on file  . Number of children: Not on file  . Years of education: Not on file  . Highest education level: Not on file  Occupational History  . Not on file  Tobacco Use  . Smoking status: Current Every Day Smoker    Packs/day: 1.00    Years: 10.00    Pack years: 10.00    Types: Cigarettes  . Smokeless tobacco: Never Used  Vaping Use  . Vaping Use: Never used  Substance and Sexual Activity  . Alcohol use: Never  . Drug use: Never  . Sexual activity: Not on file  Other Topics Concern  . Not on file  Social History Narrative  Resides with sister and brother in law    Social Determinants of Health   Financial Resource Strain:   . Difficulty of Paying Living Expenses:   Food Insecurity:   . Worried About Charity fundraiser in the Last Year:   . Arboriculturist in the Last Year:   Transportation Needs:   . Film/video editor (Medical):   Marland Kitchen Lack of Transportation (Non-Medical):   Physical Activity:   . Days of Exercise per Week:   . Minutes of Exercise per Session:   Stress:   . Feeling of Stress :   Social Connections:   . Frequency of Communication with Friends and Family:   . Frequency of Social Gatherings with Friends and Family:   . Attends Religious Services:    . Active Member of Clubs or Organizations:   . Attends Archivist Meetings:   Marland Kitchen Marital Status:   Intimate Partner Violence:   . Fear of Current or Ex-Partner:   . Emotionally Abused:   Marland Kitchen Physically Abused:   . Sexually Abused:     Outpatient Medications Prior to Visit  Medication Sig Dispense Refill  . acetaminophen (TYLENOL) 325 MG tablet Take 2 tablets (650 mg total) by mouth every 4 (four) hours as needed for mild pain (or temp > 37.5 C (99.5 F)).    Marland Kitchen amLODipine (NORVASC) 10 MG tablet Take 1 tablet (10 mg total) by mouth daily. (Patient not taking: Reported on 08/13/2019) 90 tablet 1  . atorvastatin (LIPITOR) 20 MG tablet Take 1 tablet (20 mg total) by mouth daily at 6 PM. (Patient not taking: Reported on 08/13/2019) 30 tablet 1  . ferrous sulfate 325 (65 FE) MG EC tablet Take 325 mg by mouth daily with breakfast. (Patient not taking: Reported on 08/13/2019)    . lisinopril (PRINIVIL,ZESTRIL) 40 MG tablet Take 1 tablet (40 mg total) by mouth daily. (Patient not taking: Reported on 08/13/2019) 30 tablet 1  . nicotine (NICODERM CQ - DOSED IN MG/24 HOURS) 14 mg/24hr patch 14 mg patch daily x2 weeks then 7 mg patch daily x3 weeks and stop (Patient not taking: Reported on 08/13/2019) 28 patch 0  . ondansetron (ZOFRAN) 4 MG tablet Take 1 tablet (4 mg total) by mouth every 8 (eight) hours as needed for nausea or vomiting. (Patient not taking: Reported on 08/13/2019) 20 tablet 1  . pantoprazole (PROTONIX) 40 MG tablet Take 1 tablet (40 mg total) by mouth daily. (Patient not taking: Reported on 08/13/2019) 30 tablet 0  . senna-docusate (SENOKOT-S) 8.6-50 MG tablet Take 1 tablet by mouth 2 (two) times daily.     No facility-administered medications prior to visit.    No Known Allergies  ROS Review of Systems  Constitutional: Positive for fatigue.  HENT: Negative.   Eyes: Negative.   Respiratory: Negative.   Cardiovascular: Negative.   Gastrointestinal: Negative.   Endocrine:  Negative.   Genitourinary: Negative.   Musculoskeletal: Positive for gait problem (chronic since Stroke).  Skin: Negative.   Allergic/Immunologic: Negative.   Neurological: Positive for dizziness (occasional ), weakness (unable to walk) and headaches (occasional ).  Hematological: Negative.   Psychiatric/Behavioral: Positive for confusion (chronic).      Objective:    Physical Exam Vitals and nursing note reviewed.  Constitutional:      Appearance: Normal appearance.  HENT:     Head: Normocephalic and atraumatic.     Nose: Nose normal.     Mouth/Throat:     Mouth: Mucous membranes  are moist.     Pharynx: Oropharynx is clear.  Cardiovascular:     Rate and Rhythm: Normal rate and regular rhythm.     Pulses: Normal pulses.     Heart sounds: Normal heart sounds.  Pulmonary:     Effort: Pulmonary effort is normal.     Breath sounds: Normal breath sounds.  Abdominal:     General: Bowel sounds are normal.     Palpations: Abdomen is soft.  Musculoskeletal:        General: Normal range of motion.     Cervical back: Normal range of motion and neck supple.  Skin:    General: Skin is warm and dry.  Neurological:     General: No focal deficit present.     Mental Status: He is alert and oriented to person, place, and time.     Motor: Weakness (chronic) present.     Coordination: Coordination abnormal.     Gait: Gait abnormal.     Comments: Chronic memory deficits since Stroke 2019  Psychiatric:     Comments: Significant memory deficits, r/t Memory Test score.     BP (!) 174/106 (BP Location: Right Arm, Patient Position: Sitting, Cuff Size: Normal)   Pulse 70   Temp 97.7 F (36.5 C)   Resp 16   SpO2 100%  Wt Readings from Last 3 Encounters:  09/16/17 157 lb 6.4 oz (71.4 kg)     Health Maintenance Due  Topic Date Due  . Hepatitis C Screening  Never done  . COVID-19 Vaccine (1) Never done  . HIV Screening  Never done  . TETANUS/TDAP  Never done  . COLONOSCOPY  Never  done    There are no preventive care reminders to display for this patient.  Lab Results  Component Value Date   TSH 2.316 09/04/2017   Lab Results  Component Value Date   WBC 4.4 08/13/2019   HGB 11.6 (L) 08/13/2019   HCT 41.4 08/13/2019   MCV 69 (L) 08/13/2019   PLT 344 08/13/2019   Lab Results  Component Value Date   NA 144 08/13/2019   K 4.1 08/13/2019   CO2 23 08/13/2019   GLUCOSE 81 08/13/2019   BUN 15 08/13/2019   CREATININE 1.42 (H) 08/13/2019   BILITOT 0.2 08/13/2019   ALKPHOS 122 (H) 08/13/2019   AST 13 08/13/2019   ALT 11 08/13/2019   PROT 8.7 (H) 08/13/2019   ALBUMIN 4.6 08/13/2019   CALCIUM 10.1 08/13/2019   ANIONGAP 12 10/19/2017   Lab Results  Component Value Date   CHOL 186 08/13/2019   Lab Results  Component Value Date   HDL 46 08/13/2019   Lab Results  Component Value Date   LDLCALC 126 (H) 08/13/2019   Lab Results  Component Value Date   TRIG 75 08/13/2019   Lab Results  Component Value Date   CHOLHDL 4.0 08/13/2019   Lab Results  Component Value Date   HGBA1C 5.4 09/04/2017   Assessment & Plan:   1. Establishing care - POC Glucose (CBG) - POC HgB A1c  2. Elevated blood pressure reading with diagnosis of hypertension Refusal to take oral medications. He will begin to use Clonidine Patch as prescribed.  - cloNIDine (CATAPRES - DOSED IN MG/24 HR) 0.2 mg/24hr patch; Place 1 patch (0.2 mg total) onto the skin once a week.  Dispense: 4 patch; Refill: 12  3. Memory deficit following cerebral infarction Memory Mental Assessment Exam performed today with extremely decreased score of 9  today, with probable further referral to Neurology. Monitor.   4. 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 Essential hypertension - cloNIDine (CATAPRES - DOSED IN MG/24 HR) 0.2 mg/24hr patch; Place 1 patch (0.2 mg total) onto the skin once a week.  Dispense: 4 patch; Refill: 12 - CBC with Differential - Comprehensive metabolic panel;  Future - Lipid Panel - Comprehensive metabolic panel  5. History of recent fall Sister will continue to use of [facility] Left as needed.   6. Family history of thalassemia - Hgb Fractionation Cascade  7. Healthcare maintenance - CBC with Differential - Comprehensive metabolic panel - Lipid Panel - TSH - PSA - Vitamin B12 - Vitamin D, 25-hydroxy  8. Follow up He will follow up in 1 week for Televisit He will follow up in 3 months for Office Visit  Meds ordered this encounter  Medications  . cloNIDine (CATAPRES - DOSED IN MG/24 HR) 0.2 mg/24hr patch    Sig: Place 1 patch (0.2 mg total) onto the skin once a week.    Dispense:  4 patch    Refill:  12    Orders Placed This Encounter  Procedures  . CBC with Differential  . Comprehensive metabolic panel  . Lipid Panel  . TSH  . PSA  . Vitamin B12  . Vitamin D, 25-hydroxy  . Hgb Fractionation Cascade  . CBC with Differential  . Comprehensive metabolic panel  . Lipid Panel  . POC Glucose (CBG)  . POC HgB A1c    Referral Orders  No referral(s) requested today    Kathe Becton,  MSN, FNP-BC [address], [place] [address] [phone]- fax   Problem List Items Addressed This Visit      Cardiovascular and Mediastinum   Essential hypertension   Relevant Medications   cloNIDine (CATAPRES - DOSED IN MG/24 HR) 0.2 mg/24hr patch   Other Relevant Orders   CBC with Differential (Completed)   Comprehensive metabolic panel (Completed)   Lipid Panel (Completed)    Other Visit Diagnoses    Encounter to establish care    -  Primary   Relevant Orders   POC Glucose (CBG) (Completed)   POC HgB A1c   Elevated blood pressure reading with diagnosis of hypertension       Relevant Medications   cloNIDine (CATAPRES - DOSED IN MG/24 HR) 0.2 mg/24hr patch   Memory deficit following cerebral infarction         History of recent fall       Family history of thalassemia       Relevant Orders   Hgb Fractionation Cascade   Healthcare maintenance       Relevant Orders   CBC with Differential   Comprehensive metabolic panel   Lipid Panel   TSH   PSA   Vitamin B12   Vitamin D, 25-hydroxy   Follow up          Meds ordered this encounter  Medications  . cloNIDine (CATAPRES - DOSED IN MG/24 HR) 0.2 mg/24hr patch    Sig: Place 1 patch (0.2 mg total) onto the skin once a week.    Dispense:  4 patch    Refill:  12    Follow-up: No follow-ups on file.    Azzie Glatter, FNP

## 2019-08-13 NOTE — Patient Instructions (Signed)
Hypertension, Adult Hypertension is another name for high blood pressure. High blood pressure forces your heart to work harder to pump blood. This can cause problems over time. There are two numbers in a blood pressure reading. There is a top number (systolic) over a bottom number (diastolic). It is best to have a blood pressure that is below 120/80. Healthy choices can help lower your blood pressure, or you may need medicine to help lower it. What are the causes? The cause of this condition is not known. Some conditions may be related to high blood pressure. What increases the risk?  Smoking.  Having type 2 diabetes mellitus, high cholesterol, or both.  Not getting enough exercise or physical activity.  Being overweight.  Having too much fat, sugar, calories, or salt (sodium) in your diet.  Drinking too much alcohol.  Having long-term (chronic) kidney disease.  Having a family history of high blood pressure.  Age. Risk increases with age.  Race. You may be at higher risk if you are African American.  Gender. Men are at higher risk than women before age 45. After age 65, women are at higher risk than men.  Having obstructive sleep apnea.  Stress. What are the signs or symptoms?  High blood pressure may not cause symptoms. Very high blood pressure (hypertensive crisis) may cause: ? Headache. ? Feelings of worry or nervousness (anxiety). ? Shortness of breath. ? Nosebleed. ? A feeling of being sick to your stomach (nausea). ? Throwing up (vomiting). ? Changes in how you see. ? Very bad chest pain. ? Seizures. How is this treated?  This condition is treated by making healthy lifestyle changes, such as: ? Eating healthy foods. ? Exercising more. ? Drinking less alcohol.  Your health care provider may prescribe medicine if lifestyle changes are not enough to get your blood pressure under control, and if: ? Your top number is above 130. ? Your bottom number is above  80.  Your personal target blood pressure may vary. Follow these instructions at home: Eating and drinking   If told, follow the DASH eating plan. To follow this plan: ? Fill one half of your plate at each meal with fruits and vegetables. ? Fill one fourth of your plate at each meal with whole grains. Whole grains include whole-wheat pasta, brown rice, and whole-grain bread. ? Eat or drink low-fat dairy products, such as skim milk or low-fat yogurt. ? Fill one fourth of your plate at each meal with low-fat (lean) proteins. Low-fat proteins include fish, chicken without skin, eggs, beans, and tofu. ? Avoid fatty meat, cured and processed meat, or chicken with skin. ? Avoid pre-made or processed food.  Eat less than 1,500 mg of salt each day.  Do not drink alcohol if: ? Your doctor tells you not to drink. ? You are pregnant, may be pregnant, or are planning to become pregnant.  If you drink alcohol: ? Limit how much you use to:  0-1 drink a day for women.  0-2 drinks a day for men. ? Be aware of how much alcohol is in your drink. In the [place] one drink equals one 12 oz bottle of beer (355 mL), one 5 oz glass of wine (148 mL), or one 1 oz glass of hard liquor (44 mL). Lifestyle   Work with your doctor to stay at a healthy weight or to lose weight. Ask your doctor what the best weight is for you.  Get at least 30 minutes of exercise most   days of the week. This may include walking, swimming, or biking.  Get at least 30 minutes of exercise that strengthens your muscles (resistance exercise) at least 3 days a week. This may include lifting weights or doing Pilates.  Do not use any products that contain nicotine or tobacco, such as cigarettes, e-cigarettes, and chewing tobacco. If you need help quitting, ask your doctor.  Check your blood pressure at home as told by your doctor.  Keep all follow-up visits as told by your doctor. This is important. Medicines  Take over-the-counter  and prescription medicines only as told by your doctor. Follow directions carefully.  Do not skip doses of blood pressure medicine. The medicine does not work as well if you skip doses. Skipping doses also puts you at risk for problems.  Ask your doctor about side effects or reactions to medicines that you should watch for. Contact a doctor if you:  Think you are having a reaction to the medicine you are taking.  Have headaches that keep coming back (recurring).  Feel dizzy.  Have swelling in your ankles.  Have trouble with your vision. Get help right away if you:  Get a very bad headache.  Start to feel mixed up (confused).  Feel weak or numb.  Feel faint.  Have very bad pain in your: ? Chest. ? Belly (abdomen).  Throw up more than once.  Have trouble breathing. Summary  Hypertension is another name for high blood pressure.  High blood pressure forces your heart to work harder to pump blood.  For most people, a normal blood pressure is less than 120/80.  Making healthy choices can help lower blood pressure. If your blood pressure does not get lower with healthy choices, you may need to take medicine. This information is not intended to replace advice given to you by your health care provider. Make sure you discuss any questions you have with your health care provider. Document Revised: 09/24/2017 Document Reviewed: 09/24/2017 Elsevier Patient Education  [place]. Clonidine Skin Patches What is this medicine? CLONIDINE (KLOE ni deen) treats high blood pressure. This medicine may be used for other purposes; ask your health care provider or pharmacist if you have questions. COMMON BRAND NAME(S): Catapres-TTS What should I tell my health care provider before I take this medicine? They need to know if you have any of these conditions:  kidney disease  an unusual or allergic reaction to clonidine, other medicines, foods, dyes, or preservatives  pregnant  or trying to get pregnant  breast-feeding How should I use this medicine? This medicine is for external use only. Follow the directions on the prescription label. Apply the patch to an area of the upper arm or part of the body that is clean, dry and hairless. Avoid injured, irritated, calloused, or scarred areas. Use a different site each time to prevent skin irritation. Do not cut or trim the patch. One patch should last for 7 days. Do not use your medicine more often than directed. Do not stop using except on the advice of your doctor or health care professional. You must gradually reduce the dose or you may get a dangerous increase in blood pressure. Talk to your pediatrician regarding the use of this medicine in children. Special care may be needed. Overdosage: If you think you have taken too much of this medicine contact a poison control center or emergency room at once. NOTE: This medicine is only for you. Do not share this medicine with others. What  if I miss a dose? Replace each patch on the same day of each week, or if the patch falls off. If you do forget to change the patch for two or three days, check with your doctor or health care professional. What may interact with this medicine? Do not take this medicine with any of the following medications:  MAOIs like Carbex, Eldepryl, Marplan, Nardil, and Parnate This medicine may also interact with the following medications:  barbiturate medicines for inducing sleep or treating seizures like phenobarbital  certain medicines for blood pressure, heart disease, irregular heart beat  certain medicines for depression, anxiety, or psychotic disturbances  prescription pain medicines This list may not describe all possible interactions. Give your health care provider a list of all the medicines, herbs, non-prescription drugs, or dietary supplements you use. Also tell them if you smoke, drink alcohol, or use illegal drugs. Some items may interact  with your medicine. What should I watch for while using this medicine? Visit your doctor or health care professional for regular checks on your progress. Check your heart rate and blood pressure regularly while you are using this medicine. Ask your doctor or health care professional what your heart rate should be and when you should contact him or her. You can shower or bathe with the skin patch in position. If the patch gets loose, cover it with the extra adhesive overlay provided. You may get drowsy or dizzy. Do not drive, use machinery, or do anything that needs mental alertness until you know how this medicine affects you. To avoid dizzy or fainting spells, do not stand or sit up quickly, especially if you are an older person. Alcohol can make you more drowsy and dizzy. Avoid alcoholic drinks. Your mouth may get dry. Chewing sugarless gum or sucking hard candy, and drinking plenty of water will help. Do not treat yourself for coughs, colds, or pain while you are using this medicine without asking your doctor or health care professional for advice. Some ingredients may increase your blood pressure. If you are going to have surgery tell your doctor or health care professional that you are using this medicine. If you are going to have a magnetic resonance imaging (MRI) procedure, tell your MRI technician if you have this patch on your body. It must be removed before a MRI. What side effects may I notice from receiving this medicine? Side effects that you should report to your doctor or health care professional as soon as possible:  allergic reactions like skin rash, itching or hives, swelling of the face, lips, or tongue  anxiety, nervousness  chest pain  depression  fast, irregular heartbeat  swelling of feet or legs  unusually weak or tired Side effects that usually do not require medical attention (report to your doctor or health care professional if they continue or are  bothersome):  change in sex drive or performance  constipation  headache  skin redness, irritation, or darkening under the patch area This list may not describe all possible side effects. Call your doctor for medical advice about side effects. You may report side effects to FDA at 1-800-FDA-1088. Where should I keep my medicine? Keep out of the reach of children. Store at room temperature between 15 and 30 degrees C (59 to 86 degrees F). Throw away any unused medicine after the expiration date. NOTE: This sheet is a summary. It may not cover all possible information. If you have questions about this medicine, talk to your doctor, pharmacist, or  health care provider.  2020 Elsevier/Gold Standard (2018-09-22 01:74:94)

## 2019-08-13 NOTE — Progress Notes (Signed)
Pt was giving the memory test on 08/13/19

## 2019-08-14 LAB — CBC WITH DIFFERENTIAL/PLATELET
Basophils Absolute: 0.1 10*3/uL (ref 0.0–0.2)
Basos: 1 %
EOS (ABSOLUTE): 0.1 10*3/uL (ref 0.0–0.4)
Eos: 3 %
Hematocrit: 41.4 % (ref 37.5–51.0)
Hemoglobin: 11.6 g/dL — ABNORMAL LOW (ref 13.0–17.7)
Immature Grans (Abs): 0 10*3/uL (ref 0.0–0.1)
Immature Granulocytes: 0 %
Lymphocytes Absolute: 1.2 10*3/uL (ref 0.7–3.1)
Lymphs: 28 %
MCH: 19.4 pg — ABNORMAL LOW (ref 26.6–33.0)
MCHC: 28 g/dL — ABNORMAL LOW (ref 31.5–35.7)
MCV: 69 fL — ABNORMAL LOW (ref 79–97)
Monocytes Absolute: 0.3 10*3/uL (ref 0.1–0.9)
Monocytes: 6 %
Neutrophils Absolute: 2.7 10*3/uL (ref 1.4–7.0)
Neutrophils: 62 %
Platelets: 344 10*3/uL (ref 150–450)
RBC: 5.98 x10E6/uL — ABNORMAL HIGH (ref 4.14–5.80)
RDW: 19.2 % — ABNORMAL HIGH (ref 11.6–15.4)
WBC: 4.4 10*3/uL (ref 3.4–10.8)

## 2019-08-14 LAB — COMPREHENSIVE METABOLIC PANEL
ALT: 11 IU/L (ref 0–44)
AST: 13 IU/L (ref 0–40)
Albumin/Globulin Ratio: 1.1 — ABNORMAL LOW (ref 1.2–2.2)
Albumin: 4.6 g/dL (ref 3.8–4.8)
Alkaline Phosphatase: 122 IU/L — ABNORMAL HIGH (ref 48–121)
BUN/Creatinine Ratio: 11 (ref 10–24)
BUN: 15 mg/dL (ref 8–27)
Bilirubin Total: 0.2 mg/dL (ref 0.0–1.2)
CO2: 23 mmol/L (ref 20–29)
Calcium: 10.1 mg/dL (ref 8.6–10.2)
Chloride: 103 mmol/L (ref 96–106)
Creatinine, Ser: 1.42 mg/dL — ABNORMAL HIGH (ref 0.76–1.27)
GFR calc Af Amer: 60 mL/min/{1.73_m2} (ref >59)
GFR calc non Af Amer: 52 mL/min/{1.73_m2} — ABNORMAL LOW (ref >59)
Globulin, Total: 4.1 g/dL (ref 1.5–4.5)
Glucose: 81 mg/dL (ref 65–99)
Potassium: 4.1 mmol/L (ref 3.5–5.2)
Sodium: 144 mmol/L (ref 134–144)
Total Protein: 8.7 g/dL — ABNORMAL HIGH (ref 6.0–8.5)

## 2019-08-14 LAB — LIPID PANEL
Chol/HDL Ratio: 4 ratio (ref 0.0–5.0)
Cholesterol, Total: 186 mg/dL (ref 100–199)
HDL: 46 mg/dL (ref >39)
LDL Chol Calc (NIH): 126 mg/dL — ABNORMAL HIGH (ref 0–99)
Triglycerides: 75 mg/dL (ref 0–149)
VLDL Cholesterol Cal: 14 mg/dL (ref 5–40)

## 2019-08-15 ENCOUNTER — Encounter: Payer: Self-pay | Admitting: Family Medicine

## 2019-08-15 DIAGNOSIS — I69311 Memory deficit following cerebral infarction: Secondary | ICD-10-CM | POA: Insufficient documentation

## 2019-08-16 ENCOUNTER — Telehealth: Payer: Self-pay

## 2019-08-16 NOTE — Telephone Encounter (Signed)
Pt was called @ 12:27 pm. Sister stated that he does not have his medicine because she can not find his Medicaid card. [name] sister was advised she could ask for a copy of his medicaid card from social services. She stated she will try to go today  But his BP was 160/100 today and it is running about the same everyday.

## 2019-08-16 NOTE — Telephone Encounter (Signed)
-----   Message from Azzie Glatter, [place] sent at 08/15/2019  6:51 PM EDT ----- Regarding: "Elevated BPs" Ethelbert Thain,   Please speak with patient's sister, as she is his care taker. Please assess if patient has began using Clonidine Patch for blood pressure. Please respond with Telephone Encounter. Thank you.

## 2019-08-19 ENCOUNTER — Other Ambulatory Visit: Payer: Self-pay | Admitting: Family Medicine

## 2019-08-19 DIAGNOSIS — I10 Essential (primary) hypertension: Secondary | ICD-10-CM

## 2019-08-19 MED ORDER — CLONIDINE 0.2 MG/24HR TD PTWK: 0.2000 mg | MEDICATED_PATCH | TRANSDERMAL | 12 refills | Status: AC

## 2019-08-20 ENCOUNTER — Other Ambulatory Visit: Payer: Self-pay

## 2019-08-20 ENCOUNTER — Ambulatory Visit (INDEPENDENT_AMBULATORY_CARE_PROVIDER_SITE_OTHER): Payer: Medicaid Other | Admitting: Family Medicine

## 2019-08-20 DIAGNOSIS — Z8673 Personal history of transient ischemic attack (TIA), and cerebral infarction without residual deficits: Secondary | ICD-10-CM | POA: Diagnosis not present

## 2019-08-20 DIAGNOSIS — I10 Essential (primary) hypertension: Secondary | ICD-10-CM

## 2019-08-20 DIAGNOSIS — Z9119 Patient's noncompliance with other medical treatment and regimen: Secondary | ICD-10-CM | POA: Diagnosis not present

## 2019-08-20 DIAGNOSIS — Z91199 Patient's noncompliance with other medical treatment and regimen due to unspecified reason: Secondary | ICD-10-CM

## 2019-08-20 DIAGNOSIS — Z7409 Other reduced mobility: Secondary | ICD-10-CM

## 2019-08-20 DIAGNOSIS — F0150 Vascular dementia without behavioral disturbance: Secondary | ICD-10-CM

## 2019-08-20 DIAGNOSIS — Z09 Encounter for follow-up examination after completed treatment for conditions other than malignant neoplasm: Secondary | ICD-10-CM

## 2019-08-20 NOTE — Progress Notes (Signed)
Virtual Visit via Telephone Note  I connected with Luis Wiley on 08/27/19 at  8:00 AM EDT by telephone and verified that I am speaking with the correct person using two identifiers.   I discussed the limitations, risks, security and privacy concerns of performing an evaluation and management service by telephone and the availability of in person appointments. I also discussed with the patient that there may be a patient responsible charge related to this service. The patient expressed understanding and agreed to proceed.  Televisit Today Patient Location: Home Provider Location: Office   History of Present Illness:  Social History   Socioeconomic History  . Marital status: Single    Spouse name: Not on file  . Number of children: Not on file  . Years of education: Not on file  . Highest education level: Not on file  Occupational History  . Not on file  Tobacco Use  . Smoking status: Current Every Day Smoker    Packs/day: 1.00    Years: 10.00    Pack years: 10.00    Types: Cigarettes  . Smokeless tobacco: Never Used  Vaping Use  . Vaping Use: Never used  Substance and Sexual Activity  . Alcohol use: Never  . Drug use: Never  . Sexual activity: Not on file  Other Topics Concern  . Not on file  Social History Narrative   Resides with sister and brother in law    Social Determinants of Health   Financial Resource Strain:   . Difficulty of Paying Living Expenses:   Food Insecurity:   . Worried About Charity fundraiser in the Last Year:   . Arboriculturist in the Last Year:   Transportation Needs:   . Film/video editor (Medical):   Marland Kitchen Lack of Transportation (Non-Medical):   Physical Activity:   . Days of Exercise per Week:   . Minutes of Exercise per Session:   Stress:   . Feeling of Stress :   Social Connections:   . Frequency of Communication with Friends and Family:   . Frequency of Social Gatherings with Friends and Family:   . Attends Religious  Services:   . Active Member of Clubs or Organizations:   . Attends Archivist Meetings:   Marland Kitchen Marital Status:   Intimate Partner Violence:   . Fear of Current or Ex-Partner:   . Emotionally Abused:   Marland Kitchen Physically Abused:   . Sexually Abused:    Past Medical History:  Diagnosis Date  . Abnormality of gait and mobility 07/2019  . Arthritis   . Assistance needed for mobility 07/2019  . At high risk for falls 07/2019  . Chronic joint pain   . Hypertension   . Memory deficits 09/2019  . Stroke ([facility]) 09/2017  . Vascular dementia without behavioral disturbance ([place]) 07/2019  . Wheelchair bound 07/2019    Family History  Problem Relation Age of Onset  . Hypertension Mother   . Diabetes Mother     No Known Allergies  Patient Active Problem List   Diagnosis Date Noted  . Memory deficit following cerebral infarction 08/15/2019  . Tobacco abuse 09/06/2017  . CKD (chronic kidney disease) stage 3, GFR 30-59 ml/min 09/06/2017  . Anemia 09/06/2017  . 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 Essential hypertension   . ICH (intracerebral hemorrhage) ([facility]) 09/02/2017     Current Status: Since his last office visit, he is doing well with no complaints. Patient has not picked up  Clonidine Patch from pharmacy as of yet. His sister, who is his caregiver states that she will be having surgery soon and needs to have her brother placed in a nursing rehab facility at that time. He denies fevers, chills, fatigue, recent infections, weight loss, and night sweats. He has not had any headaches, visual changes, dizziness, and falls. No chest pain, heart palpitations, cough and shortness of breath reported. Denies GI problems such as nausea, vomiting, diarrhea, and constipation. He has no reports of blood in stools, dysuria and hematuria. No depression or anxiety, and denies suicidal ideations, homicidal ideations, or auditory hallucinations. He is taking all medications as prescribed. He denies  pain today.    Observations/Objective:  Telephone Visit  Assessment and Plan:  1. Chronic hypertension He will began to take medications as prescribed, to decrease high sodium intake, excessive alcohol intake, increase potassium intake, smoking cessation, and increase physical activity of at least 30 minutes of cardio activity daily. He will continue to follow Heart Healthy or DASH diet.  2. Elevated blood pressure reading with diagnosis of hypertension Patient has not began Clonidine Patch as of yet. Sister states that home blood pressure reading remain elevated.   3. Essential hypertension  4. Non compliance with medical treatment Sister states that she will pick up Rx for Clonidine Patch 0.2 mg from [facility] asap.   5. Vascular dementia without behavioral disturbance ([facility] [facility])  6. History of stroke  7. Decreased ambulation status Wheelchair/Bedbound.   8. Follow up He will follow up in 3 months.  No orders of the defined types were placed in this encounter.   No orders of the defined types were placed in this encounter.   Referral Orders  No referral(s) requested today    Kathe Becton,  MSN, FNP-BC [facility] [address] [place], [place] [address] [phone] fax   I discussed the assessment and treatment plan with the patient. The patient was provided an opportunity to ask questions and all were answered. The patient agreed with the plan and demonstrated an understanding of the instructions.   The patient was advised to call back or seek an in-person evaluation if the symptoms worsen or if the condition fails to improve as anticipated.  I provided 20 minutes of non-face-to-face time during this encounter.   Azzie Glatter, FNP

## 2019-08-23 MED FILL — cloNIDine 0.2 MG/24HR PTWK: 0.2 | 28 days supply | Qty: 4 | Fill #0

## 2019-08-27 DIAGNOSIS — Z91199 Patient's noncompliance with other medical treatment and regimen due to unspecified reason: Secondary | ICD-10-CM | POA: Insufficient documentation

## 2019-08-27 DIAGNOSIS — F0150 Vascular dementia without behavioral disturbance: Secondary | ICD-10-CM | POA: Insufficient documentation

## 2019-08-27 DIAGNOSIS — Z9119 Patient's noncompliance with other medical treatment and regimen: Secondary | ICD-10-CM | POA: Insufficient documentation

## 2019-08-27 DIAGNOSIS — Z7409 Other reduced mobility: Secondary | ICD-10-CM | POA: Insufficient documentation

## 2019-08-27 DIAGNOSIS — Z8673 Personal history of transient ischemic attack (TIA), and cerebral infarction without residual deficits: Secondary | ICD-10-CM | POA: Insufficient documentation

## 2019-09-10 ENCOUNTER — Telehealth: Payer: Self-pay

## 2019-09-14 ENCOUNTER — Telehealth: Payer: Self-pay

## 2019-09-15 ENCOUNTER — Telehealth: Payer: Self-pay | Admitting: Family Medicine

## 2019-09-15 NOTE — Telephone Encounter (Signed)
Message left for patient's sister to contact office for lab results.

## 2019-09-22 MED FILL — CATAPRES-TTS 2 PATCH: 0.2 | 28 days supply | Qty: 4 | Fill #1

## 2019-09-22 NOTE — Telephone Encounter (Signed)
PT daughter came to pick up his paperwork. On 09/18/2019.

## 2019-09-29 DIAGNOSIS — R413 Other amnesia: Secondary | ICD-10-CM

## 2019-09-29 HISTORY — DX: Other amnesia: R41.3

## 2019-10-06 MED FILL — cloNIDine 0.2 MG/24HR PTWK: 0.2 | 28 days supply | Qty: 4 | Fill #1

## 2019-11-03 NOTE — Telephone Encounter (Signed)
NO note

## 2019-11-12 ENCOUNTER — Ambulatory Visit: Payer: Self-pay | Admitting: Family Medicine

## 2019-12-28 ENCOUNTER — Other Ambulatory Visit: Payer: Self-pay

## 2019-12-28 ENCOUNTER — Encounter: Payer: Self-pay | Admitting: Family Medicine

## 2019-12-28 ENCOUNTER — Ambulatory Visit (INDEPENDENT_AMBULATORY_CARE_PROVIDER_SITE_OTHER): Payer: Medicaid Other | Admitting: Family Medicine

## 2019-12-28 VITALS — BP 181/100 | HR 68 | Temp 98.2°F | Resp 17 | Ht 70.0 in | Wt 174.4 lb

## 2019-12-28 DIAGNOSIS — R42 Dizziness and giddiness: Secondary | ICD-10-CM

## 2019-12-28 DIAGNOSIS — I69311 Memory deficit following cerebral infarction: Secondary | ICD-10-CM

## 2019-12-28 DIAGNOSIS — Z8673 Personal history of transient ischemic attack (TIA), and cerebral infarction without residual deficits: Secondary | ICD-10-CM | POA: Diagnosis not present

## 2019-12-28 DIAGNOSIS — F0150 Vascular dementia without behavioral disturbance: Secondary | ICD-10-CM | POA: Diagnosis not present

## 2019-12-28 DIAGNOSIS — Z7409 Other reduced mobility: Secondary | ICD-10-CM

## 2019-12-28 DIAGNOSIS — M6281 Muscle weakness (generalized): Secondary | ICD-10-CM

## 2019-12-28 DIAGNOSIS — I10 Essential (primary) hypertension: Secondary | ICD-10-CM

## 2019-12-28 DIAGNOSIS — Z09 Encounter for follow-up examination after completed treatment for conditions other than malignant neoplasm: Secondary | ICD-10-CM

## 2019-12-28 NOTE — Progress Notes (Signed)
Patient [facility]   Established Patient Office Visit  Subjective:  Patient ID: Luis Wiley, male    DOB: [birth year]  Age: 64 y.o. MRN: [id number]  CC:  Chief Complaint  Patient presents with  . Follow-up    dizzy X3 days  . Back Pain    HPI Luis Wiley is a 64 year old male who presents for Follow Up today.    Patient Active Problem List   Diagnosis Date Noted  . Decreased ambulation status 08/27/2019  . History of stroke 08/27/2019  . Vascular dementia without behavioral disturbance (Luis Wiley) 08/27/2019  . Non compliance with medical treatment 08/27/2019  . Memory deficit following cerebral infarction 08/15/2019  . Tobacco abuse 09/06/2017  . CKD (chronic kidney disease) stage 3, GFR 30-59 ml/min (HCC) 09/06/2017  . Anemia 09/06/2017  . 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 Essential hypertension   . ICH (intracerebral hemorrhage) (Luis Wiley) 09/02/2017    Current Status: Since his last office visit, he is doing well with no complaints. Blood pressures are elevated today.He has c/o frequent dizziness, which he is no longer taking any medications by mouth, r/t Dementia. Sister who is his caregiver states that diastolic is normally around 100's. Patient is incontinent of bowel and bladder, and non-verbal. Patient declines referral to Neurology. He denies visual changes, chest pain, cough, shortness of breath, heart palpitations, and falls. He has occasional headaches and dizziness with position changes. Denies severe headaches, confusion, seizures, double vision, and blurred vision, nausea and vomiting. He denies fevers, chills, recent infections, weight loss, and night sweats. Denies GI problems such as diarrhea, and constipation. He has no reports of blood in stools, dysuria and hematuria. No depression or anxiety reported today. He is taking all medications as prescribed. He denies pain today.   Past Medical History:  Diagnosis Date    . Abnormality of gait and mobility 07/2019  . Arthritis   . Assistance needed for mobility 07/2019  . At high risk for falls 07/2019  . Chronic joint pain   . Hypertension   . Memory deficits 09/2019  . Stroke ([facility]) 09/2017  . Vascular dementia without behavioral disturbance (Luis Wiley) 07/2019  . Wheelchair bound 07/2019    No past surgical history on file.  Family History  Problem Relation Age of Onset  . Hypertension Mother   . Diabetes Mother     Social History   Socioeconomic History  . Marital status: Single    Spouse name: Not on file  . Number of children: Not on file  . Years of education: Not on file  . Highest education level: Not on file  Occupational History  . Not on file  Tobacco Use  . Smoking status: Current Every Day Smoker    Packs/day: 1.00    Years: 10.00    Pack years: 10.00    Types: Cigarettes  . Smokeless tobacco: Never Used  Vaping Use  . Vaping Use: Never used  Substance and Sexual Activity  . Alcohol use: Never  . Drug use: Never  . Sexual activity: Not on file  Other Topics Concern  . Not on file  Social History Narrative   Resides with sister and brother in law    Social Determinants of Health   Financial Resource Strain:   . Difficulty of Paying Living Expenses: Not on file  Food Insecurity:   . Worried About Charity fundraiser in the Last Year: Not on file  .  Ran Out of Food in the Last Year: Not on file  Transportation Needs:   . Lack of Transportation (Medical): Not on file  . Lack of Transportation (Non-Medical): Not on file  Physical Activity:   . Days of Exercise per Week: Not on file  . Minutes of Exercise per Session: Not on file  Stress:   . Feeling of Stress : Not on file  Social Connections:   . Frequency of Communication with Friends and Family: Not on file  . Frequency of Social Gatherings with Friends and Family: Not on file  . Attends Religious Services: Not on file  . Active Member of Clubs or Organizations:  Not on file  . Attends Archivist Meetings: Not on file  . Marital Status: Not on file  Intimate Partner Violence:   . Fear of Current or Ex-Partner: Not on file  . Emotionally Abused: Not on file  . Physically Abused: Not on file  . Sexually Abused: Not on file    Outpatient Medications Prior to Visit  Medication Sig Dispense Refill  . acetaminophen (TYLENOL) 325 MG tablet Take 2 tablets (650 mg total) by mouth every 4 (four) hours as needed for mild pain (or temp > 37.5 C (99.5 F)).    Marland Kitchen amLODipine (NORVASC) 10 MG tablet Take 1 tablet (10 mg total) by mouth daily. (Patient not taking: Reported on 08/13/2019) 90 tablet 1  . atorvastatin (LIPITOR) 20 MG tablet Take 1 tablet (20 mg total) by mouth daily at 6 PM. (Patient not taking: Reported on 08/13/2019) 30 tablet 1  . cloNIDine (CATAPRES - DOSED IN MG/24 HR) 0.2 mg/24hr patch Place 1 patch (0.2 mg total) onto the skin once a week. (Patient not taking: Reported on 08/20/2019) 4 patch 12  . ferrous sulfate 325 (65 FE) MG EC tablet Take 325 mg by mouth daily with breakfast. (Patient not taking: Reported on 08/13/2019)    . lisinopril (PRINIVIL,ZESTRIL) 40 MG tablet Take 1 tablet (40 mg total) by mouth daily. (Patient not taking: Reported on 08/13/2019) 30 tablet 1  . nicotine (NICODERM CQ - DOSED IN MG/24 HOURS) 14 mg/24hr patch 14 mg patch daily x2 weeks then 7 mg patch daily x3 weeks and stop (Patient not taking: Reported on 08/13/2019) 28 patch 0  . ondansetron (ZOFRAN) 4 MG tablet Take 1 tablet (4 mg total) by mouth every 8 (eight) hours as needed for nausea or vomiting. (Patient not taking: Reported on 08/13/2019) 20 tablet 1  . pantoprazole (PROTONIX) 40 MG tablet Take 1 tablet (40 mg total) by mouth daily. (Patient not taking: Reported on 08/13/2019) 30 tablet 0  . senna-docusate (SENOKOT-S) 8.6-50 MG tablet Take 1 tablet by mouth 2 (two) times daily. (Patient not taking: Reported on 08/20/2019)     No facility-administered medications  prior to visit.    No Known Allergies  ROS Review of Systems  Constitutional: Negative.        Wheelchair bound  HENT: Negative.   Eyes: Negative.   Respiratory: Negative.   Cardiovascular: Negative.   Gastrointestinal: Negative.   Endocrine: Negative.   Genitourinary: Negative.   Musculoskeletal: Negative.   Skin: Negative.   Allergic/Immunologic: Negative.   Neurological: Positive for dizziness (frequent) and weakness (left extremity weakness).  Hematological: Negative.   Psychiatric/Behavioral: Negative.       Objective:    Physical Exam Vitals and nursing note reviewed. Exam conducted with a chaperone present (Daughter).  Constitutional:      Appearance: Normal appearance.  HENT:  Head: Normocephalic.     Nose: Nose normal.     Mouth/Throat:     Mouth: Mucous membranes are moist.     Pharynx: Oropharynx is clear.  Cardiovascular:     Rate and Rhythm: Normal rate and regular rhythm.     Pulses: Normal pulses.     Heart sounds: Normal heart sounds.  Pulmonary:     Effort: Pulmonary effort is normal.     Breath sounds: Normal breath sounds.  Abdominal:     General: Bowel sounds are normal.  Musculoskeletal:     Cervical back: Normal range of motion and neck supple.     Comments: Decreased ROM left extremity  Skin:    General: Skin is warm and dry.  Neurological:     Mental Status: He is alert.  Psychiatric:        Judgment: Judgment normal.     Comments: Flat effect/ Non-verbal     BP (!) 181/100 (BP Location: Right Arm, Patient Position: Sitting, Cuff Size: Normal)   Pulse 68   Temp 98.2 F (36.8 C)   Resp 17   Ht 5\' 10"  (1.778 m)   Wt 174 lb 6.4 oz (79.1 kg)   SpO2 100%   BMI 25.02 kg/m  Wt Readings from Last 3 Encounters:  12/28/19 174 lb 6.4 oz (79.1 kg)  09/16/17 157 lb 6.4 oz (71.4 kg)     Health Maintenance Due  Topic Date Due  . Hepatitis C Screening  Never done  . COVID-19 Vaccine (1) Never done  . HIV Screening  Never done    . COLONOSCOPY  Never done    There are no preventive care reminders to display for this patient.  Lab Results  Component Value Date   TSH 2.316 09/04/2017   Lab Results  Component Value Date   WBC 4.4 08/13/2019   HGB 11.6 (L) 08/13/2019   HCT 41.4 08/13/2019   MCV 69 (L) 08/13/2019   PLT 344 08/13/2019   Lab Results  Component Value Date   NA 144 08/13/2019   K 4.1 08/13/2019   CO2 23 08/13/2019   GLUCOSE 81 08/13/2019   BUN 15 08/13/2019   CREATININE 1.42 (H) 08/13/2019   BILITOT 0.2 08/13/2019   ALKPHOS 122 (H) 08/13/2019   AST 13 08/13/2019   ALT 11 08/13/2019   PROT 8.7 (H) 08/13/2019   ALBUMIN 4.6 08/13/2019   CALCIUM 10.1 08/13/2019   ANIONGAP 12 10/19/2017   Lab Results  Component Value Date   CHOL 186 08/13/2019   Lab Results  Component Value Date   HDL 46 08/13/2019   Lab Results  Component Value Date   LDLCALC 126 (H) 08/13/2019   Lab Results  Component Value Date   TRIG 75 08/13/2019   Lab Results  Component Value Date   CHOLHDL 4.0 08/13/2019   Lab Results  Component Value Date   HGBA1C 5.4 09/04/2017      Assessment & Plan:   1. Vascular dementia without behavioral disturbance [facility]) Sister requesting forms to be completed for SNF placement.   2. History of stroke Stable today. No signs or symptoms of recurrence reported or noted today.   3. Elevated blood pressure reading with diagnosis of hypertension  4. Chronic hypertension Patient refuses to take oral medications. He is currently on Clonidine Patch.   5. Decreased ambulation status Wheelchair bound.   6. Memory deficit following cerebral infarction Vascular Dementia.   7. Dizziness  8. Muscle weakness of left upper  extremity  9. Follow up He will follow up in 6 months.   No orders of the defined types were placed in this encounter.  No orders of the defined types were placed in this encounter.   Referral Orders  No referral(s) requested today   Kathe Becton,  MSN, FNP-BC [facility] [address] [place], [place] [address] [phone] fax  Problem List Items Addressed This Visit      Nervous and Auditory   Vascular dementia without behavioral disturbance ([place]) - Primary     Other   Decreased ambulation status   History of stroke   Memory deficit following cerebral infarction    Other Visit Diagnoses    Elevated blood pressure reading with diagnosis of hypertension       Chronic hypertension       Dizziness       Muscle weakness of left upper extremity       Follow up          No orders of the defined types were placed in this encounter.   Follow-up: Return in about 6 months (around 06/26/2020).    Azzie Glatter, FNP

## 2019-12-29 ENCOUNTER — Encounter: Payer: Self-pay | Admitting: Family Medicine

## 2019-12-30 ENCOUNTER — Encounter: Payer: Self-pay | Admitting: Family Medicine
